# Patient Record
Sex: Male | Born: 1950 | Race: White | Hispanic: No | Marital: Married | Smoking: Never smoker
Health system: Southern US, Community
[De-identification: ages and names within clinical notes are randomized; demographics above are authoritative.]

## PROBLEM LIST (undated history)

## (undated) HISTORY — PX: KNEE SURGERY: SHX244

## (undated) HISTORY — PX: APPENDECTOMY: SHX54

---

## 1999-07-09 ENCOUNTER — Ambulatory Visit (HOSPITAL_COMMUNITY): Admission: RE | Admit: 1999-07-09 | Discharge: 1999-07-09 | Payer: Self-pay | Admitting: *Deleted

## 2001-04-21 ENCOUNTER — Encounter: Payer: Self-pay | Admitting: Surgery

## 2001-04-21 ENCOUNTER — Encounter: Admission: RE | Admit: 2001-04-21 | Discharge: 2001-04-21 | Payer: Self-pay | Admitting: Surgery

## 2005-07-18 ENCOUNTER — Ambulatory Visit (HOSPITAL_COMMUNITY): Admission: RE | Admit: 2005-07-18 | Discharge: 2005-07-18 | Payer: Self-pay | Admitting: Chiropractic Medicine

## 2007-04-15 IMAGING — CR DG LUMBAR SPINE COMPLETE 4+V
5 series · 5 of 5 positions shown · non-contrast
Comparison: None.

CLINICAL DATA: Patient has had low back pain for two months.
 LUMBAR SPINE ? 4 VIEW:

[t l-spine a.p. *]
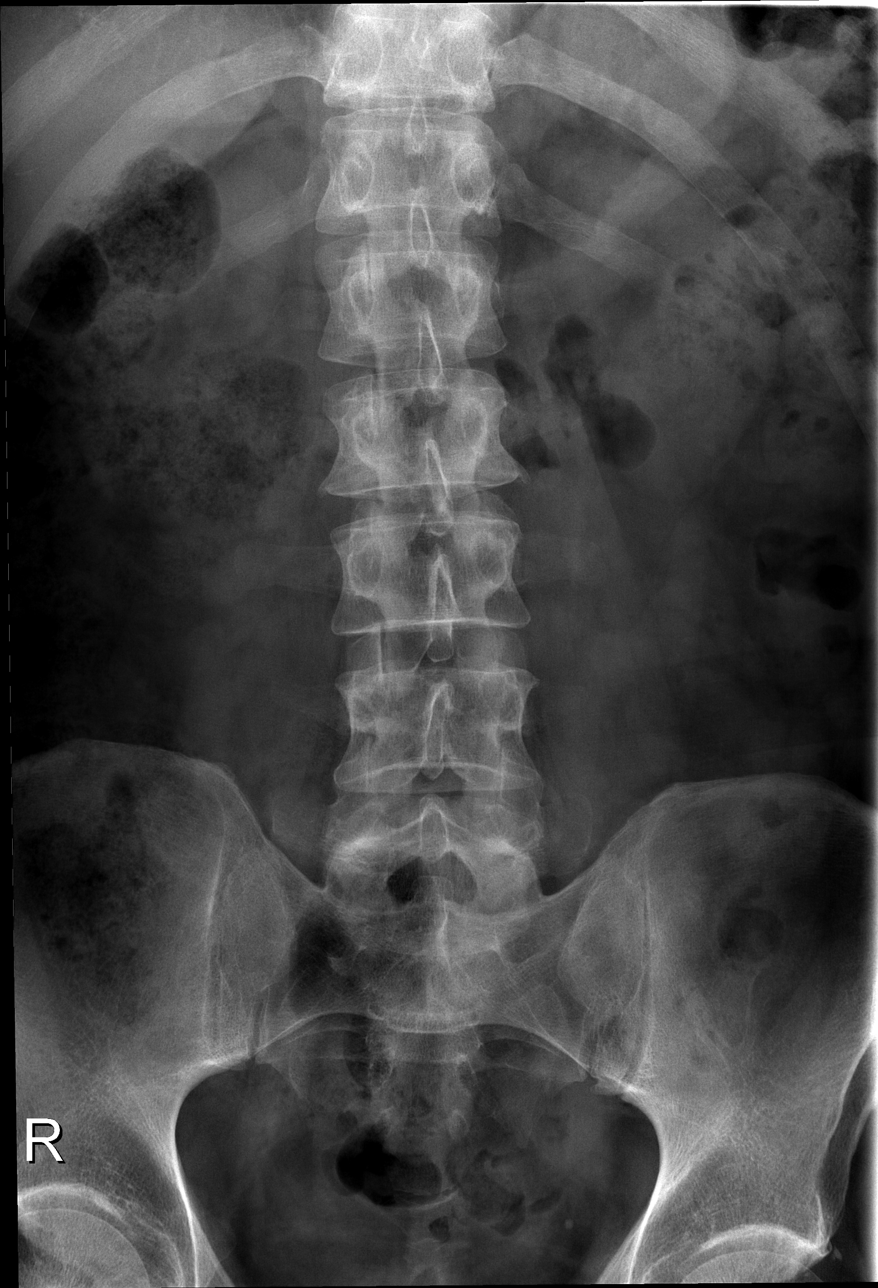

[t l-spine oblique exposure]
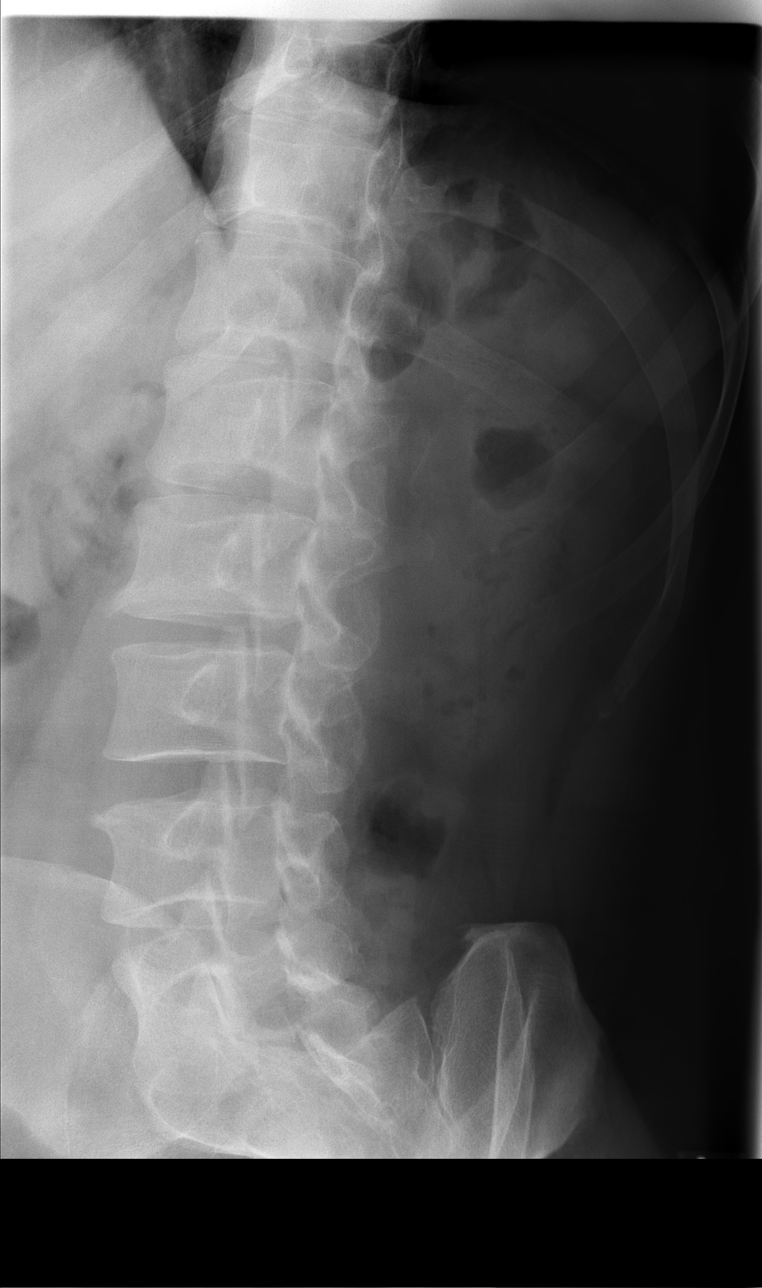

[t l-spine oblique exposure *]
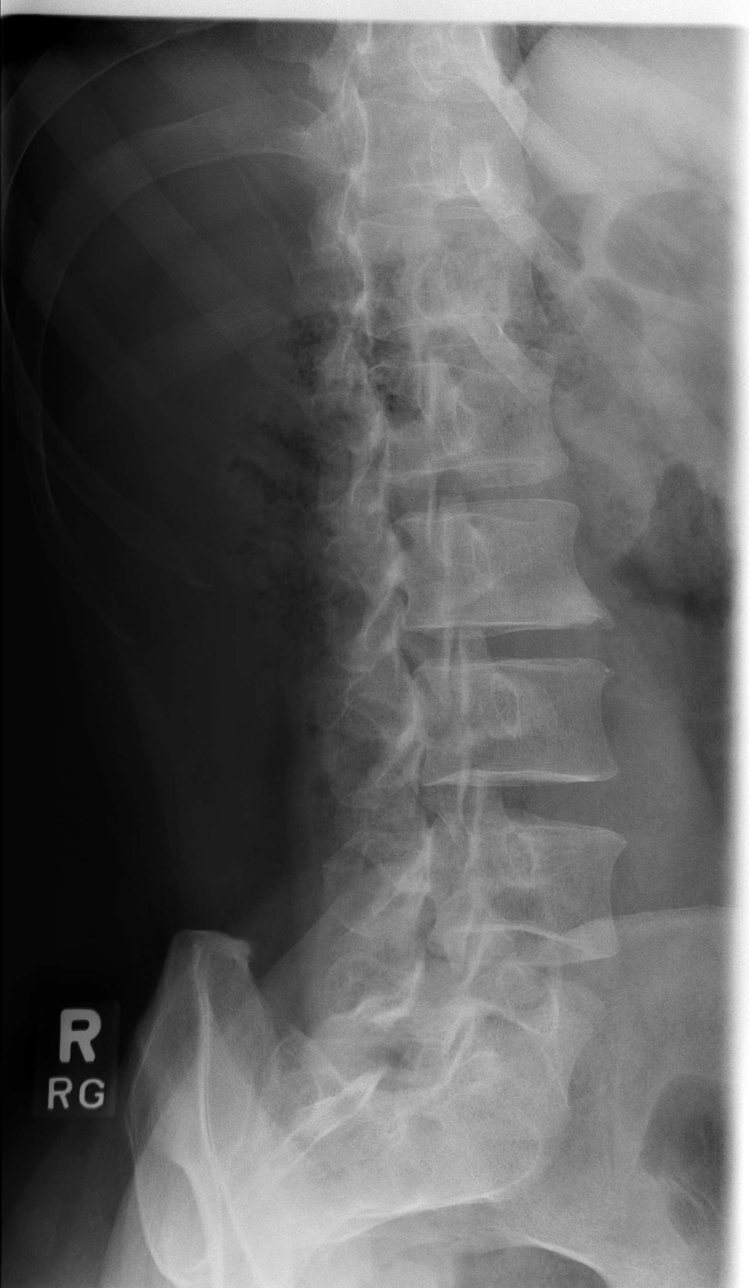

[t l-spine lat]
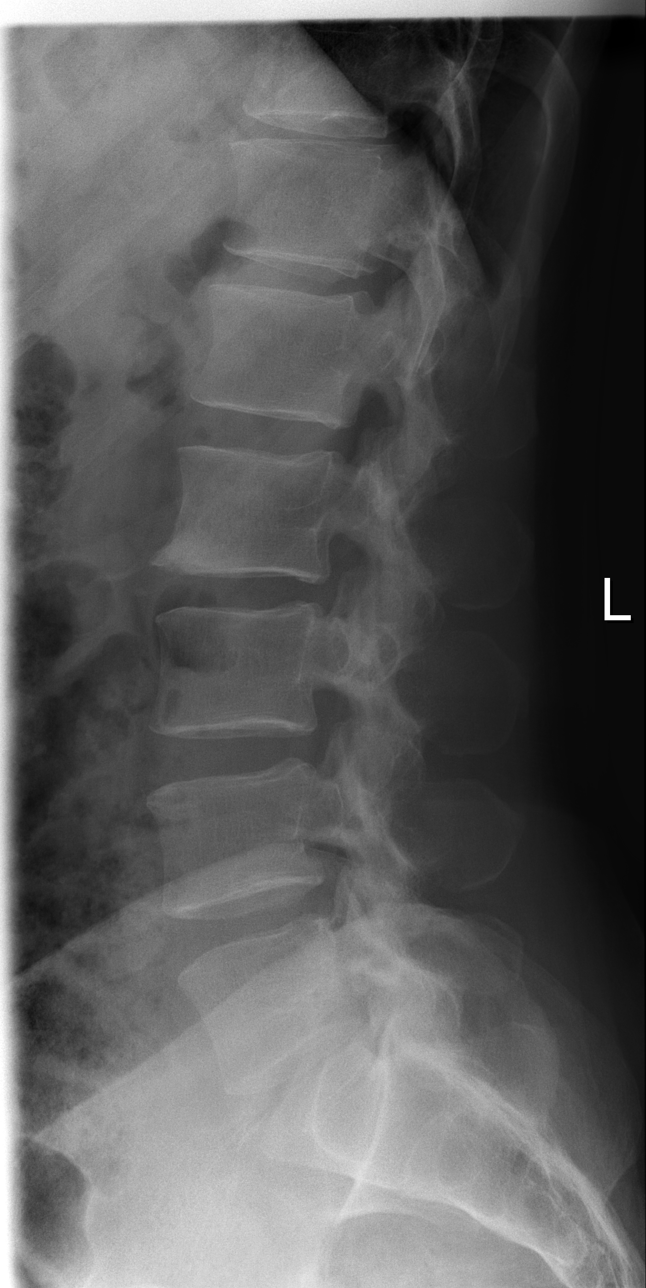

[t l-spine l5-s1 spot]
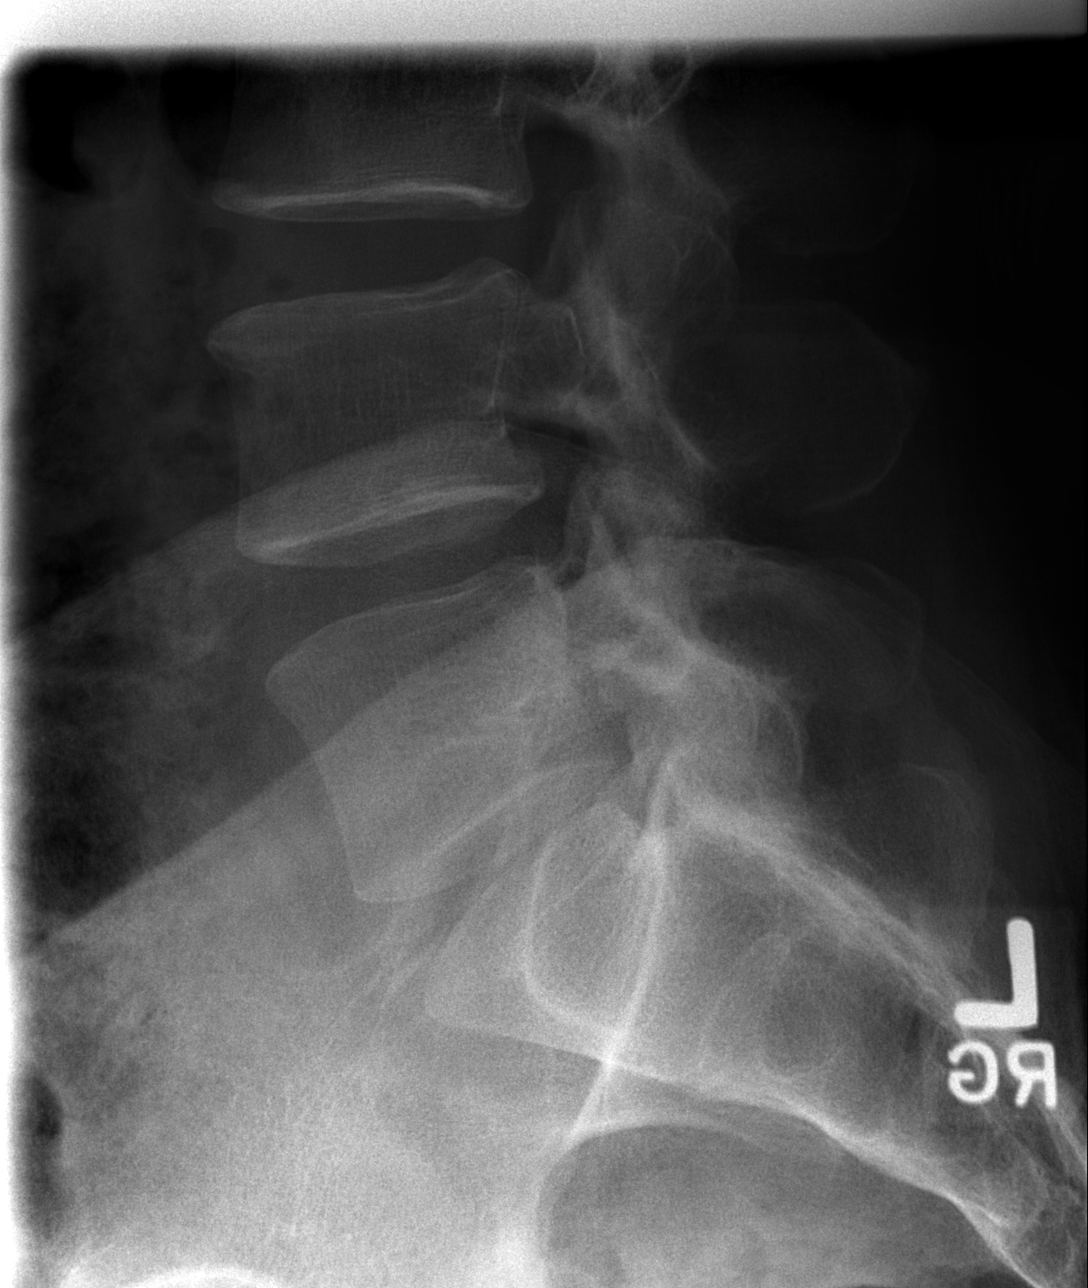

[5 of 5 positions shown; findings below may reference images not displayed]

FINDINGS: There are five lumbar-type vertebrae.  No pars defects.  There is satisfactory alignment.  Mild degenerative disc disease is noted at L2-3.  No acute abnormality.
IMPRESSION: Mild degenerative disc disease is noted at L2-3 without other abnormality.

## 2014-02-18 ENCOUNTER — Ambulatory Visit (INDEPENDENT_AMBULATORY_CARE_PROVIDER_SITE_OTHER): Payer: PRIVATE HEALTH INSURANCE

## 2014-02-18 ENCOUNTER — Ambulatory Visit (INDEPENDENT_AMBULATORY_CARE_PROVIDER_SITE_OTHER): Payer: PRIVATE HEALTH INSURANCE | Admitting: Podiatrist

## 2014-02-18 ENCOUNTER — Encounter: Payer: Self-pay | Admitting: Podiatrist

## 2014-02-18 VITALS — BP 124/76 | HR 64 | Resp 16 | Ht 73.0 in | Wt 200.0 lb

## 2014-02-18 DIAGNOSIS — M6740 Ganglion, unspecified site: Secondary | ICD-10-CM

## 2014-02-18 DIAGNOSIS — M7150 Other bursitis, not elsewhere classified, unspecified site: Secondary | ICD-10-CM

## 2014-02-18 NOTE — Progress Notes (Signed)
   Subjective:    Patient ID: [name], male    DOB: 09-Jan-1951, 63 y.o.   MRN: [id number]  HPI Comments: Right lateral side of foot, sharp pain , painful with pressure and to the touch. Its been going on for about 3 months , knot on the side of foot it has gotten a little bit bigger   Foot Pain      Review of Systems  Musculoskeletal:       Difficulty walking   Allergic/Immunologic: Positive for food allergies.       Objective:   Physical Exam Patient is awake, alert, and oriented x 3.  In no acute distress.  Vascular status is intact with palpable pedal pulses at 2/4 DP and PT bilateral and capillary refill time within normal limits. Neurological sensation is also intact bilaterally via Semmes Weinstein monofilament at 5/5 sites. Light touch, vibratory sensation, Achilles tendon reflex is intact. Dermatological exam reveals skin color, turger and texture as normal. No open lesions present.  Musculature intact with dorsiflexion, plantarflexion, inversion, eversion.  Pain along lateral aspect of the right foot is noted. Just proximal to the fifth metatarsal base and insertion of the peroneus brevis tendon. There is an area of swelling that is seen both clinically and on x-ray there appears to be consistent with a cystic/bursitis inflammation. Discrete pain on palpation in this area is also noted in subjectively reported.    Assessment & Plan:  Bursitis, cyst lateral right foot  Plan: Discussed trying to drain the cyst versus a steroid injection and we decided to do the steroid injection at today's visit this was carried out under sterile technique he tolerated this very well. He will call if this does not help him we will consider drainage at that time.

## 2014-02-18 NOTE — Patient Instructions (Signed)

## 2015-10-05 DIAGNOSIS — Z125 Encounter for screening for malignant neoplasm of prostate: Secondary | ICD-10-CM | POA: Diagnosis not present

## 2015-10-05 DIAGNOSIS — Z136 Encounter for screening for cardiovascular disorders: Secondary | ICD-10-CM | POA: Diagnosis not present

## 2015-10-05 DIAGNOSIS — Z0000 Encounter for general adult medical examination without abnormal findings: Secondary | ICD-10-CM | POA: Diagnosis not present

## 2015-10-05 DIAGNOSIS — G4733 Obstructive sleep apnea (adult) (pediatric): Secondary | ICD-10-CM | POA: Diagnosis not present

## 2016-11-08 DIAGNOSIS — N529 Male erectile dysfunction, unspecified: Secondary | ICD-10-CM | POA: Diagnosis not present

## 2016-11-08 DIAGNOSIS — Z0000 Encounter for general adult medical examination without abnormal findings: Secondary | ICD-10-CM | POA: Diagnosis not present

## 2016-11-08 DIAGNOSIS — Z125 Encounter for screening for malignant neoplasm of prostate: Secondary | ICD-10-CM | POA: Diagnosis not present

## 2016-11-08 DIAGNOSIS — Z1159 Encounter for screening for other viral diseases: Secondary | ICD-10-CM | POA: Diagnosis not present

## 2016-11-08 DIAGNOSIS — G4733 Obstructive sleep apnea (adult) (pediatric): Secondary | ICD-10-CM | POA: Diagnosis not present

## 2016-11-08 DIAGNOSIS — R102 Pelvic and perineal pain: Secondary | ICD-10-CM | POA: Diagnosis not present

## 2016-11-08 DIAGNOSIS — Z1389 Encounter for screening for other disorder: Secondary | ICD-10-CM | POA: Diagnosis not present

## 2016-12-24 DIAGNOSIS — H2513 Age-related nuclear cataract, bilateral: Secondary | ICD-10-CM | POA: Diagnosis not present

## 2016-12-24 DIAGNOSIS — H524 Presbyopia: Secondary | ICD-10-CM | POA: Diagnosis not present

## 2016-12-24 DIAGNOSIS — H5203 Hypermetropia, bilateral: Secondary | ICD-10-CM | POA: Diagnosis not present

## 2016-12-24 DIAGNOSIS — H52223 Regular astigmatism, bilateral: Secondary | ICD-10-CM | POA: Diagnosis not present

## 2017-09-10 DIAGNOSIS — K645 Perianal venous thrombosis: Secondary | ICD-10-CM | POA: Diagnosis not present

## 2017-11-18 DIAGNOSIS — E559 Vitamin D deficiency, unspecified: Secondary | ICD-10-CM | POA: Diagnosis not present

## 2017-11-18 DIAGNOSIS — Z6827 Body mass index (BMI) 27.0-27.9, adult: Secondary | ICD-10-CM | POA: Diagnosis not present

## 2017-11-18 DIAGNOSIS — Z125 Encounter for screening for malignant neoplasm of prostate: Secondary | ICD-10-CM | POA: Diagnosis not present

## 2017-11-18 DIAGNOSIS — Z1389 Encounter for screening for other disorder: Secondary | ICD-10-CM | POA: Diagnosis not present

## 2017-11-18 DIAGNOSIS — Z0000 Encounter for general adult medical examination without abnormal findings: Secondary | ICD-10-CM | POA: Diagnosis not present

## 2017-11-18 DIAGNOSIS — G4733 Obstructive sleep apnea (adult) (pediatric): Secondary | ICD-10-CM | POA: Diagnosis not present

## 2017-11-18 DIAGNOSIS — N529 Male erectile dysfunction, unspecified: Secondary | ICD-10-CM | POA: Diagnosis not present

## 2018-12-02 DIAGNOSIS — G4733 Obstructive sleep apnea (adult) (pediatric): Secondary | ICD-10-CM | POA: Diagnosis not present

## 2018-12-02 DIAGNOSIS — E559 Vitamin D deficiency, unspecified: Secondary | ICD-10-CM | POA: Diagnosis not present

## 2018-12-02 DIAGNOSIS — Z125 Encounter for screening for malignant neoplasm of prostate: Secondary | ICD-10-CM | POA: Diagnosis not present

## 2018-12-02 DIAGNOSIS — M79673 Pain in unspecified foot: Secondary | ICD-10-CM | POA: Diagnosis not present

## 2018-12-02 DIAGNOSIS — Z0000 Encounter for general adult medical examination without abnormal findings: Secondary | ICD-10-CM | POA: Diagnosis not present

## 2018-12-02 DIAGNOSIS — Z1389 Encounter for screening for other disorder: Secondary | ICD-10-CM | POA: Diagnosis not present

## 2018-12-02 DIAGNOSIS — N529 Male erectile dysfunction, unspecified: Secondary | ICD-10-CM | POA: Diagnosis not present

## 2018-12-29 DIAGNOSIS — Z0000 Encounter for general adult medical examination without abnormal findings: Secondary | ICD-10-CM | POA: Diagnosis not present

## 2018-12-29 DIAGNOSIS — Z125 Encounter for screening for malignant neoplasm of prostate: Secondary | ICD-10-CM | POA: Diagnosis not present

## 2018-12-29 DIAGNOSIS — M79673 Pain in unspecified foot: Secondary | ICD-10-CM | POA: Diagnosis not present

## 2019-02-27 ENCOUNTER — Other Ambulatory Visit: Payer: Self-pay | Admitting: Internal Medicine

## 2019-02-27 DIAGNOSIS — Z20822 Contact with and (suspected) exposure to covid-19: Secondary | ICD-10-CM

## 2019-03-02 LAB — NOVEL CORONAVIRUS, NAA: SARS-CoV-2, NAA: NOT DETECTED

## 2019-05-07 DIAGNOSIS — M545 Low back pain: Secondary | ICD-10-CM | POA: Diagnosis not present

## 2019-05-07 DIAGNOSIS — R269 Unspecified abnormalities of gait and mobility: Secondary | ICD-10-CM | POA: Diagnosis not present

## 2019-05-24 DIAGNOSIS — M545 Low back pain: Secondary | ICD-10-CM | POA: Diagnosis not present

## 2019-05-24 DIAGNOSIS — R269 Unspecified abnormalities of gait and mobility: Secondary | ICD-10-CM | POA: Diagnosis not present

## 2019-05-31 DIAGNOSIS — R269 Unspecified abnormalities of gait and mobility: Secondary | ICD-10-CM | POA: Diagnosis not present

## 2019-05-31 DIAGNOSIS — M545 Low back pain: Secondary | ICD-10-CM | POA: Diagnosis not present

## 2019-08-11 ENCOUNTER — Ambulatory Visit: Payer: PPO | Attending: Internal Medicine

## 2019-08-11 DIAGNOSIS — Z20822 Contact with and (suspected) exposure to covid-19: Secondary | ICD-10-CM | POA: Insufficient documentation

## 2019-08-12 LAB — NOVEL CORONAVIRUS, NAA: SARS-CoV-2, NAA: NOT DETECTED

## 2019-08-18 ENCOUNTER — Ambulatory Visit: Payer: PPO | Attending: Internal Medicine

## 2019-08-18 DIAGNOSIS — Z20822 Contact with and (suspected) exposure to covid-19: Secondary | ICD-10-CM | POA: Diagnosis not present

## 2019-08-19 LAB — NOVEL CORONAVIRUS, NAA: SARS-CoV-2, NAA: NOT DETECTED

## 2019-08-21 ENCOUNTER — Ambulatory Visit: Payer: Medicare Other | Attending: Internal Medicine

## 2019-08-21 DIAGNOSIS — Z23 Encounter for immunization: Secondary | ICD-10-CM

## 2019-08-21 NOTE — Progress Notes (Signed)
   Covid-19 Vaccination Clinic  Name:  [name]    MRN: [id number] DOB: 1951/06/18  08/21/2019  [name] was observed post Covid-19 immunization for 15 minutes without incidence. He was provided with Vaccine Information Sheet and instruction to access the V-Safe system.   [name] was instructed to call 911 with any severe reactions post vaccine: Marland Kitchen Difficulty breathing  . Swelling of your face and throat  . A fast heartbeat  . A bad rash all over your body  . Dizziness and weakness    Immunizations Administered    Name Date Dose VIS Date Route   Pfizer COVID-19 Vaccine 08/21/2019 10:53 AM 0.3 mL 07/16/2019 Intramuscular   Manufacturer: [employer], [employer]   Lot: [id number]   NDC: [address]-5

## 2019-09-11 ENCOUNTER — Ambulatory Visit: Payer: Medicare Other | Attending: Internal Medicine

## 2019-09-11 DIAGNOSIS — Z23 Encounter for immunization: Secondary | ICD-10-CM

## 2019-09-11 NOTE — Progress Notes (Signed)
   Covid-19 Vaccination Clinic  Name:  [name]    MRN: [id number] DOB: November 28, 1950  09/11/2019  [name] was observed post Covid-19 immunization for 15 minutes without incidence. He was provided with Vaccine Information Sheet and instruction to access the V-Safe system.   [name] was instructed to call 911 with any severe reactions post vaccine: Marland Kitchen Difficulty breathing  . Swelling of your face and throat  . A fast heartbeat  . A bad rash all over your body  . Dizziness and weakness    Immunizations Administered    Name Date Dose VIS Date Route   Pfizer COVID-19 Vaccine 09/11/2019 10:37 AM 0.3 mL 07/16/2019 Intramuscular   Manufacturer: [employer], [employer]   Lot: [id number]   NDC: [address]-4

## 2019-11-30 DIAGNOSIS — H00025 Hordeolum internum left lower eyelid: Secondary | ICD-10-CM | POA: Diagnosis not present

## 2020-01-12 DIAGNOSIS — Z1389 Encounter for screening for other disorder: Secondary | ICD-10-CM | POA: Diagnosis not present

## 2020-01-12 DIAGNOSIS — E559 Vitamin D deficiency, unspecified: Secondary | ICD-10-CM | POA: Diagnosis not present

## 2020-01-12 DIAGNOSIS — Z125 Encounter for screening for malignant neoplasm of prostate: Secondary | ICD-10-CM | POA: Diagnosis not present

## 2020-01-12 DIAGNOSIS — Z0000 Encounter for general adult medical examination without abnormal findings: Secondary | ICD-10-CM | POA: Diagnosis not present

## 2020-01-12 DIAGNOSIS — G4733 Obstructive sleep apnea (adult) (pediatric): Secondary | ICD-10-CM | POA: Diagnosis not present

## 2020-01-12 DIAGNOSIS — R448 Other symptoms and signs involving general sensations and perceptions: Secondary | ICD-10-CM | POA: Diagnosis not present

## 2020-01-12 DIAGNOSIS — N529 Male erectile dysfunction, unspecified: Secondary | ICD-10-CM | POA: Diagnosis not present

## 2020-02-23 DIAGNOSIS — R946 Abnormal results of thyroid function studies: Secondary | ICD-10-CM | POA: Diagnosis not present

## 2020-04-13 DIAGNOSIS — Z1211 Encounter for screening for malignant neoplasm of colon: Secondary | ICD-10-CM | POA: Diagnosis not present

## 2020-07-26 DIAGNOSIS — E039 Hypothyroidism, unspecified: Secondary | ICD-10-CM | POA: Diagnosis not present

## 2020-07-26 DIAGNOSIS — R5383 Other fatigue: Secondary | ICD-10-CM | POA: Diagnosis not present

## 2020-07-26 DIAGNOSIS — E60 Dietary zinc deficiency: Secondary | ICD-10-CM | POA: Diagnosis not present

## 2020-07-26 DIAGNOSIS — F5101 Primary insomnia: Secondary | ICD-10-CM | POA: Diagnosis not present

## 2020-07-26 DIAGNOSIS — K5900 Constipation, unspecified: Secondary | ICD-10-CM | POA: Diagnosis not present

## 2020-08-15 DIAGNOSIS — Z20822 Contact with and (suspected) exposure to covid-19: Secondary | ICD-10-CM | POA: Diagnosis not present

## 2020-08-16 DIAGNOSIS — Z20822 Contact with and (suspected) exposure to covid-19: Secondary | ICD-10-CM | POA: Diagnosis not present

## 2020-08-16 DIAGNOSIS — Z03818 Encounter for observation for suspected exposure to other biological agents ruled out: Secondary | ICD-10-CM | POA: Diagnosis not present

## 2021-03-29 DIAGNOSIS — J069 Acute upper respiratory infection, unspecified: Secondary | ICD-10-CM | POA: Diagnosis not present

## 2021-03-29 DIAGNOSIS — U071 COVID-19: Secondary | ICD-10-CM | POA: Diagnosis not present

## 2021-03-29 DIAGNOSIS — R6883 Chills (without fever): Secondary | ICD-10-CM | POA: Diagnosis not present

## 2021-03-29 DIAGNOSIS — R051 Acute cough: Secondary | ICD-10-CM | POA: Diagnosis not present

## 2021-03-29 DIAGNOSIS — R0981 Nasal congestion: Secondary | ICD-10-CM | POA: Diagnosis not present

## 2021-03-29 DIAGNOSIS — R519 Headache, unspecified: Secondary | ICD-10-CM | POA: Diagnosis not present

## 2021-05-24 DIAGNOSIS — Z136 Encounter for screening for cardiovascular disorders: Secondary | ICD-10-CM | POA: Diagnosis not present

## 2021-05-24 DIAGNOSIS — Z1389 Encounter for screening for other disorder: Secondary | ICD-10-CM | POA: Diagnosis not present

## 2021-05-24 DIAGNOSIS — E559 Vitamin D deficiency, unspecified: Secondary | ICD-10-CM | POA: Diagnosis not present

## 2021-05-24 DIAGNOSIS — G4733 Obstructive sleep apnea (adult) (pediatric): Secondary | ICD-10-CM | POA: Diagnosis not present

## 2021-05-24 DIAGNOSIS — Z13220 Encounter for screening for lipoid disorders: Secondary | ICD-10-CM | POA: Diagnosis not present

## 2021-05-24 DIAGNOSIS — N529 Male erectile dysfunction, unspecified: Secondary | ICD-10-CM | POA: Diagnosis not present

## 2021-05-24 DIAGNOSIS — R448 Other symptoms and signs involving general sensations and perceptions: Secondary | ICD-10-CM | POA: Diagnosis not present

## 2021-05-24 DIAGNOSIS — Z0000 Encounter for general adult medical examination without abnormal findings: Secondary | ICD-10-CM | POA: Diagnosis not present

## 2021-05-24 DIAGNOSIS — Z125 Encounter for screening for malignant neoplasm of prostate: Secondary | ICD-10-CM | POA: Diagnosis not present

## 2021-06-21 DIAGNOSIS — Z1211 Encounter for screening for malignant neoplasm of colon: Secondary | ICD-10-CM | POA: Diagnosis not present

## 2022-01-11 DIAGNOSIS — R61 Generalized hyperhidrosis: Secondary | ICD-10-CM | POA: Diagnosis not present

## 2022-01-15 ENCOUNTER — Other Ambulatory Visit: Payer: Self-pay | Admitting: Physician Assistant

## 2022-01-15 ENCOUNTER — Ambulatory Visit
Admission: RE | Admit: 2022-01-15 | Discharge: 2022-01-15 | Disposition: A | Discharge status: Home / Self Care | Payer: PPO | Source: Ambulatory Visit | Attending: Physician Assistant | Admitting: Physician Assistant

## 2022-01-15 DIAGNOSIS — J984 Other disorders of lung: Secondary | ICD-10-CM | POA: Diagnosis not present

## 2022-01-15 DIAGNOSIS — R61 Generalized hyperhidrosis: Secondary | ICD-10-CM | POA: Diagnosis not present

## 2022-01-24 DIAGNOSIS — Z1331 Encounter for screening for depression: Secondary | ICD-10-CM | POA: Diagnosis not present

## 2022-01-24 DIAGNOSIS — N529 Male erectile dysfunction, unspecified: Secondary | ICD-10-CM | POA: Diagnosis not present

## 2022-01-24 DIAGNOSIS — Z1211 Encounter for screening for malignant neoplasm of colon: Secondary | ICD-10-CM | POA: Diagnosis not present

## 2022-01-24 DIAGNOSIS — Z0000 Encounter for general adult medical examination without abnormal findings: Secondary | ICD-10-CM | POA: Diagnosis not present

## 2022-01-24 DIAGNOSIS — Z125 Encounter for screening for malignant neoplasm of prostate: Secondary | ICD-10-CM | POA: Diagnosis not present

## 2022-01-24 DIAGNOSIS — E559 Vitamin D deficiency, unspecified: Secondary | ICD-10-CM | POA: Diagnosis not present

## 2022-01-24 DIAGNOSIS — R448 Other symptoms and signs involving general sensations and perceptions: Secondary | ICD-10-CM | POA: Diagnosis not present

## 2022-01-24 DIAGNOSIS — G4733 Obstructive sleep apnea (adult) (pediatric): Secondary | ICD-10-CM | POA: Diagnosis not present

## 2022-02-20 DIAGNOSIS — G4733 Obstructive sleep apnea (adult) (pediatric): Secondary | ICD-10-CM | POA: Diagnosis not present

## 2022-03-05 DIAGNOSIS — M9907 Segmental and somatic dysfunction of upper extremity: Secondary | ICD-10-CM | POA: Diagnosis not present

## 2022-03-05 DIAGNOSIS — M25512 Pain in left shoulder: Secondary | ICD-10-CM | POA: Diagnosis not present

## 2022-03-05 DIAGNOSIS — M9908 Segmental and somatic dysfunction of rib cage: Secondary | ICD-10-CM | POA: Diagnosis not present

## 2022-03-05 DIAGNOSIS — M7552 Bursitis of left shoulder: Secondary | ICD-10-CM | POA: Diagnosis not present

## 2022-03-19 DIAGNOSIS — M25512 Pain in left shoulder: Secondary | ICD-10-CM | POA: Diagnosis not present

## 2022-03-19 DIAGNOSIS — S86012A Strain of left Achilles tendon, initial encounter: Secondary | ICD-10-CM | POA: Diagnosis not present

## 2022-03-28 DIAGNOSIS — G4733 Obstructive sleep apnea (adult) (pediatric): Secondary | ICD-10-CM | POA: Diagnosis not present

## 2022-04-02 DIAGNOSIS — G4733 Obstructive sleep apnea (adult) (pediatric): Secondary | ICD-10-CM | POA: Diagnosis not present

## 2022-04-24 DIAGNOSIS — G4733 Obstructive sleep apnea (adult) (pediatric): Secondary | ICD-10-CM | POA: Diagnosis not present

## 2022-04-30 DIAGNOSIS — M9908 Segmental and somatic dysfunction of rib cage: Secondary | ICD-10-CM | POA: Diagnosis not present

## 2022-04-30 DIAGNOSIS — M9907 Segmental and somatic dysfunction of upper extremity: Secondary | ICD-10-CM | POA: Diagnosis not present

## 2022-04-30 DIAGNOSIS — M25512 Pain in left shoulder: Secondary | ICD-10-CM | POA: Diagnosis not present

## 2022-04-30 DIAGNOSIS — S86012D Strain of left Achilles tendon, subsequent encounter: Secondary | ICD-10-CM | POA: Diagnosis not present

## 2022-05-24 DIAGNOSIS — G4733 Obstructive sleep apnea (adult) (pediatric): Secondary | ICD-10-CM | POA: Diagnosis not present

## 2022-05-29 DIAGNOSIS — M25512 Pain in left shoulder: Secondary | ICD-10-CM | POA: Diagnosis not present

## 2022-05-29 DIAGNOSIS — M7552 Bursitis of left shoulder: Secondary | ICD-10-CM | POA: Diagnosis not present

## 2022-07-08 DIAGNOSIS — G4733 Obstructive sleep apnea (adult) (pediatric): Secondary | ICD-10-CM | POA: Diagnosis not present

## 2022-07-16 DIAGNOSIS — D122 Benign neoplasm of ascending colon: Secondary | ICD-10-CM | POA: Diagnosis not present

## 2022-07-16 DIAGNOSIS — D123 Benign neoplasm of transverse colon: Secondary | ICD-10-CM | POA: Diagnosis not present

## 2022-07-16 DIAGNOSIS — K649 Unspecified hemorrhoids: Secondary | ICD-10-CM | POA: Diagnosis not present

## 2022-07-16 DIAGNOSIS — Z1211 Encounter for screening for malignant neoplasm of colon: Secondary | ICD-10-CM | POA: Diagnosis not present

## 2022-07-18 DIAGNOSIS — D122 Benign neoplasm of ascending colon: Secondary | ICD-10-CM | POA: Diagnosis not present

## 2022-07-18 DIAGNOSIS — D123 Benign neoplasm of transverse colon: Secondary | ICD-10-CM | POA: Diagnosis not present

## 2022-08-23 DIAGNOSIS — G4733 Obstructive sleep apnea (adult) (pediatric): Secondary | ICD-10-CM | POA: Diagnosis not present

## 2022-09-12 DIAGNOSIS — J019 Acute sinusitis, unspecified: Secondary | ICD-10-CM | POA: Diagnosis not present

## 2022-09-27 ENCOUNTER — Ambulatory Visit
Admission: RE | Admit: 2022-09-27 | Discharge: 2022-09-27 | Disposition: A | Discharge status: Home / Self Care | Payer: PPO | Source: Ambulatory Visit | Attending: Physician Assistant | Admitting: Physician Assistant

## 2022-09-27 ENCOUNTER — Other Ambulatory Visit: Payer: Self-pay | Admitting: Physician Assistant

## 2022-09-27 DIAGNOSIS — R058 Other specified cough: Secondary | ICD-10-CM

## 2022-09-27 DIAGNOSIS — R059 Cough, unspecified: Secondary | ICD-10-CM | POA: Diagnosis not present

## 2022-11-12 ENCOUNTER — Ambulatory Visit (INDEPENDENT_AMBULATORY_CARE_PROVIDER_SITE_OTHER): Payer: PPO

## 2022-11-12 ENCOUNTER — Ambulatory Visit (INDEPENDENT_AMBULATORY_CARE_PROVIDER_SITE_OTHER): Payer: PPO | Admitting: Sports Medicine

## 2022-11-12 VITALS — BP 110/82 | HR 53 | Ht 73.0 in | Wt 201.0 lb

## 2022-11-12 DIAGNOSIS — M25572 Pain in left ankle and joints of left foot: Secondary | ICD-10-CM | POA: Diagnosis not present

## 2022-11-12 DIAGNOSIS — M7662 Achilles tendinitis, left leg: Secondary | ICD-10-CM

## 2022-11-12 MED ORDER — MELOXICAM 15 MG PO TABS: 15.0000 mg | ORAL_TABLET | Freq: Every day | ORAL | 0 refills | Status: DC

## 2022-11-12 NOTE — Progress Notes (Signed)
    [name] [address] [place] [address] Phone: [phone]   Assessment and Plan:     1. Left ankle pain, unspecified chronicity 2. Tendonitis, Achilles, left -Chronic with exacerbation, initial sports medicine visit - Consistent with Achilles tendinitis at distal Achilles insertion site based on HPI, physical exam, x-ray - X-ray obtained in clinic.  My interpretation: No acute fracture or dislocation.  Cortical changes seen to posterior calcaneus at Achilles insertion site - Start HEP for Achilles tendinitis - Recommend avoidance of activities that flare pain for 1 week and then gradual reintroduction as tolerated - Start meloxicam 15 mg daily x2 weeks.  If still having pain after 2 weeks, complete 3rd-week of meloxicam. May use remaining meloxicam as needed once daily for pain control.  Do not to use additional NSAIDs while taking meloxicam.  May use Tylenol [phone] mg 2 to 3 times a day for breakthrough pain.  Other orders - meloxicam (MOBIC) 15 MG tablet; Take 1 tablet (15 mg total) by mouth daily.    Pertinent previous records reviewed include none   Follow Up: 3 weeks for reevaluation.  If no improvement or worsening of symptoms, could consider ultrasound versus physical therapy versus ECSWT   Subjective:   I, [name], am serving as a Neurosurgeon for Doctor Richardean Sale  Chief Complaint: left foot pain   HPI:   11/12/22 Patient is a 72 year old male complaining of  left foot pain. Patient states that he keeps straining his achilles tendon, any time he pushes down on his foot a few times it gets to the point where he can't walk for a couple of days, this pain has been going on for a year intermittently, no meds for the pain, no numbness or tingling ,   Relevant Historical Information: None pertinent  Additional pertinent review of systems negative.   Current Outpatient Medications:    meloxicam (MOBIC)  15 MG tablet, Take 1 tablet (15 mg total) by mouth daily., Disp: 30 tablet, Rfl: 0   Objective:     Vitals:   11/12/22 1358  BP: 110/82  Pulse: (!) 53  SpO2: 97%  Weight: 201 lb (91.2 kg)  Height: 6\' 1"  (1.854 m)      Body mass index is 26.52 kg/m.    Physical Exam:    Gen: Appears well, nad, nontoxic and pleasant Psych: Alert and oriented, appropriate mood and affect Neuro: sensation intact, strength is 5/5 with df/pf/inv/ev, muscle tone wnl Skin: no susupicious lesions or rashes  Left foot/ankle:  No deformity, no swelling or effusion TTP to nodule at posterior calcaneus without open lesion, erythema, warmth NTTP over fibular head, lat mal, medial mal, achilles, navicular, base of 5th, ATFL, CFL, deltoid, calcaneous or midfoot ROM DF 30, PF 45, inv/ev intact Negative ant drawer, talar tilt, rotation test, squeeze test. Neg thompson No pain with resisted inversion or eversion  Pain over posterior calcaneus and distal Achilles with double and single-leg plantarflexion weightbearing  Electronically signed by:  [name] [name] 2:56 PM 11/12/22

## 2022-11-12 NOTE — Patient Instructions (Addendum)
Good to see you  Ankle HEP  - Start meloxicam 15 mg daily x2 weeks.  If still having pain after 2 weeks, complete 3rd-week of meloxicam. May use remaining meloxicam as needed once daily for pain control.  Do not to use additional NSAIDs while taking meloxicam.  May use Tylenol 500-1000 mg 2 to 3 times a day for breakthrough pain. 3-4 week follow up  

## 2022-11-12 NOTE — Progress Notes (Deleted)
    [name] [facility] [address] [place] [address] Phone: [phone]   Assessment and Plan:     There are no diagnoses linked to this encounter.  ***   Pertinent previous records reviewed include ***   Follow Up: ***     Subjective:   I, [name], am serving as a Neurosurgeon for Doctor Richardean Sale  Chief Complaint: left foot pain   HPI:   11/13/2022 Patient is a 72 year old male complaining of left foot pain. Patient states  Relevant Historical Information: ***  Additional pertinent review of systems negative.  No current outpatient medications on file.   Objective:     There were no vitals filed for this visit.    There is no height or weight on file to calculate BMI.    Physical Exam:    ***   Electronically signed by:  [name] [facility] 7:25 AM 11/12/22

## 2022-11-13 ENCOUNTER — Ambulatory Visit: Payer: PPO | Admitting: Sports Medicine

## 2022-12-09 NOTE — Progress Notes (Unsigned)
    [name] [name] [address] [place] [address] Phone: [phone]   Assessment and Plan:     There are no diagnoses linked to this encounter.  ***   Pertinent previous records reviewed include ***   Follow Up: ***     Subjective:   I, [name], am serving as a Neurosurgeon for Doctor [name]   Chief Complaint: left foot pain    HPI:    11/12/22 Patient is a 72 year old male complaining of  left foot pain. Patient states that he keeps straining his achilles tendon, any time he pushes down on his foot a few times it gets to the point where he can't walk for a couple of days, this pain has been going on for a year intermittently, no meds for the pain, no numbness or tingling ,   12/10/2022 Patient states    Relevant Historical Information: None pertinent Additional pertinent review of systems negative.   Current Outpatient Medications:    meloxicam (MOBIC) 15 MG tablet, Take 1 tablet (15 mg total) by mouth daily., Disp: 30 tablet, Rfl: 0   Objective:     There were no vitals filed for this visit.    There is no height or weight on file to calculate BMI.    Physical Exam:    ***   Electronically signed by:  [name] [name] 12:30 PM 12/09/22

## 2022-12-10 ENCOUNTER — Ambulatory Visit (INDEPENDENT_AMBULATORY_CARE_PROVIDER_SITE_OTHER): Payer: PPO | Admitting: Sports Medicine

## 2022-12-10 VITALS — HR 67 | Ht 73.0 in | Wt 202.0 lb

## 2022-12-10 DIAGNOSIS — M25572 Pain in left ankle and joints of left foot: Secondary | ICD-10-CM

## 2022-12-10 DIAGNOSIS — M7662 Achilles tendinitis, left leg: Secondary | ICD-10-CM

## 2022-12-10 DIAGNOSIS — G8929 Other chronic pain: Secondary | ICD-10-CM | POA: Diagnosis not present

## 2023-02-07 DIAGNOSIS — R21 Rash and other nonspecific skin eruption: Secondary | ICD-10-CM | POA: Diagnosis not present

## 2023-06-06 DIAGNOSIS — K601 Chronic anal fissure: Secondary | ICD-10-CM | POA: Diagnosis not present

## 2023-06-06 DIAGNOSIS — K6289 Other specified diseases of anus and rectum: Secondary | ICD-10-CM | POA: Diagnosis not present

## 2023-10-13 IMAGING — CR DG CHEST 2V
2 series · 2 of 2 positions shown · non-contrast
Comparison: None Available.

CLINICAL DATA: Generalized hyperhidrosis.  Sweating.

EXAM:
CHEST - 2 VIEW

[w chest pa]
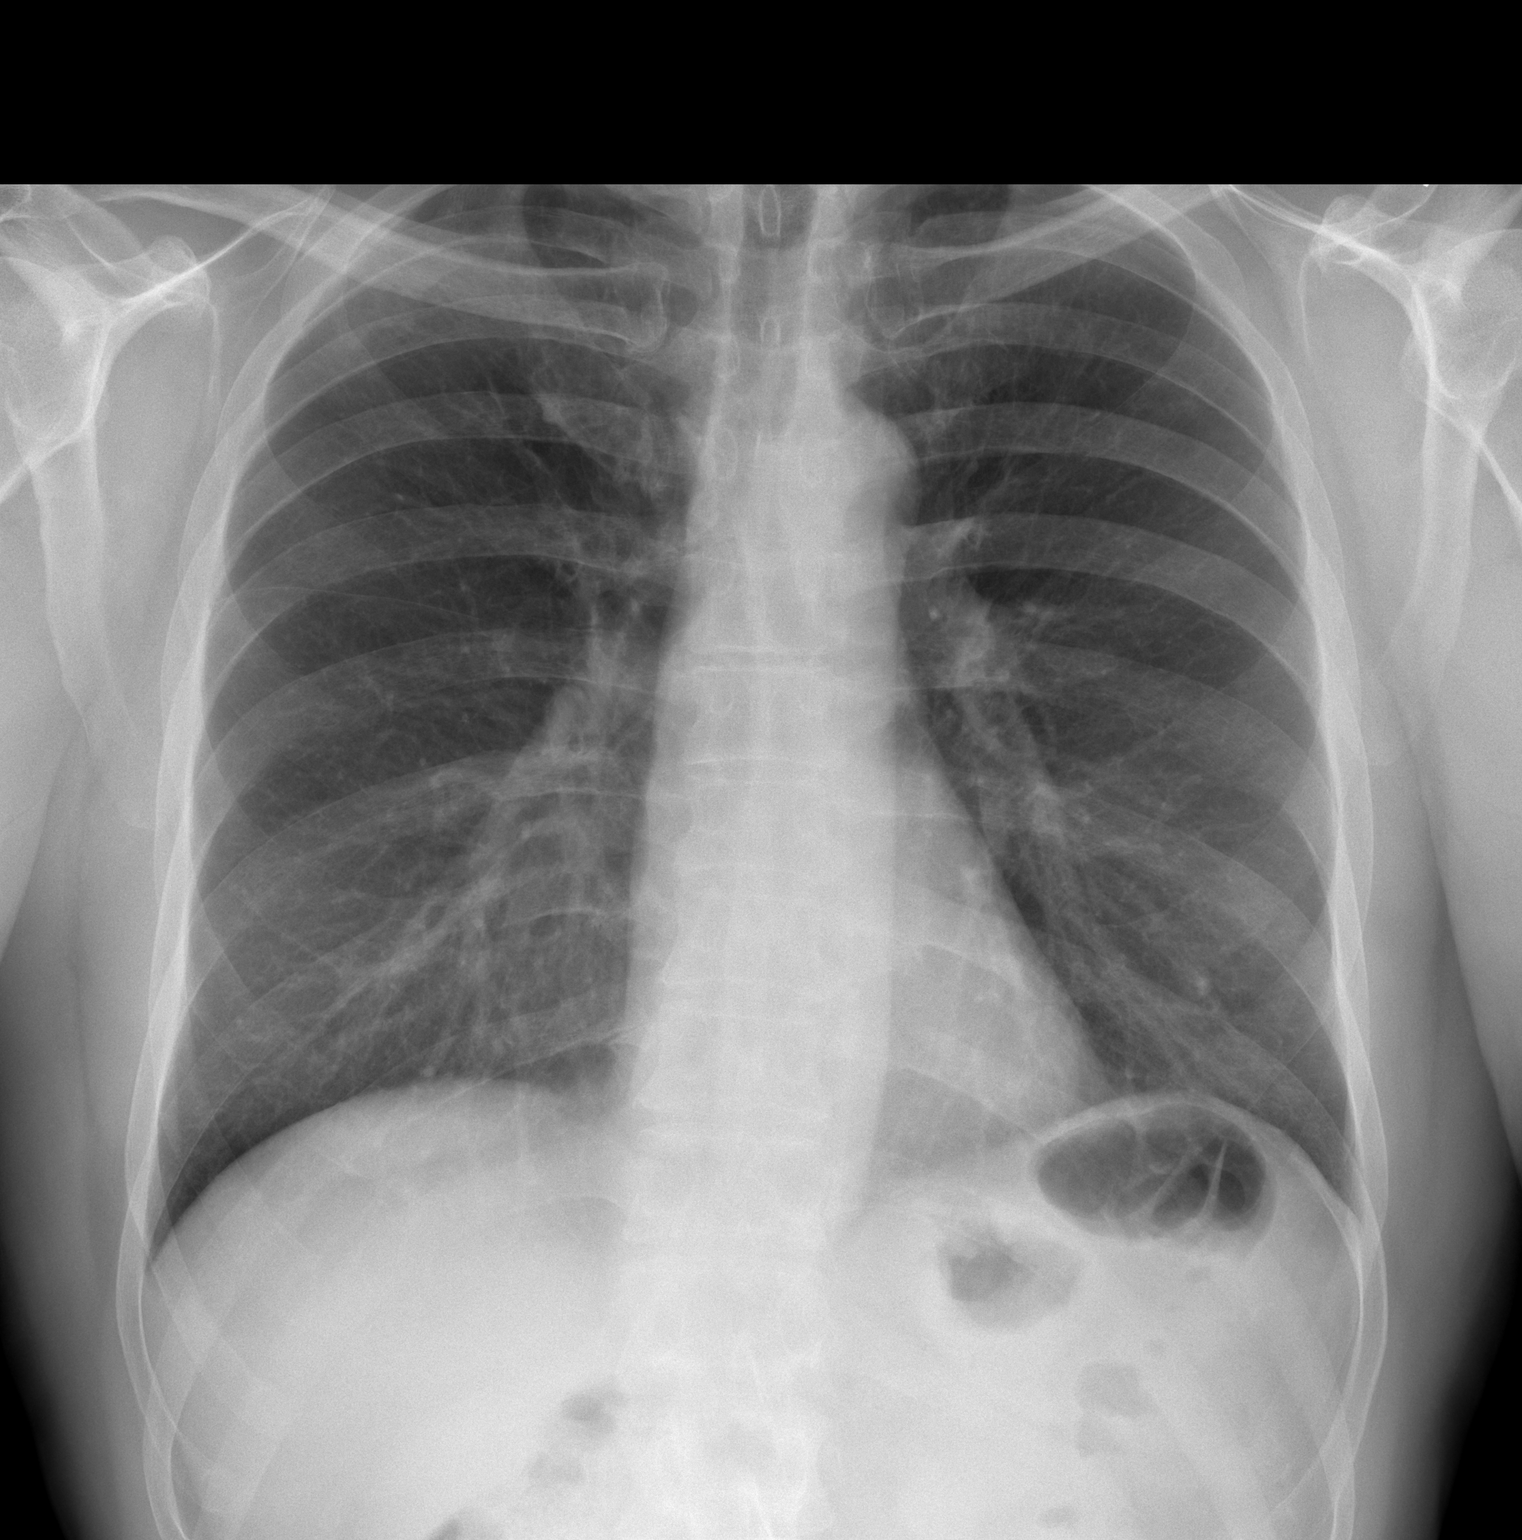

[w chest lat]
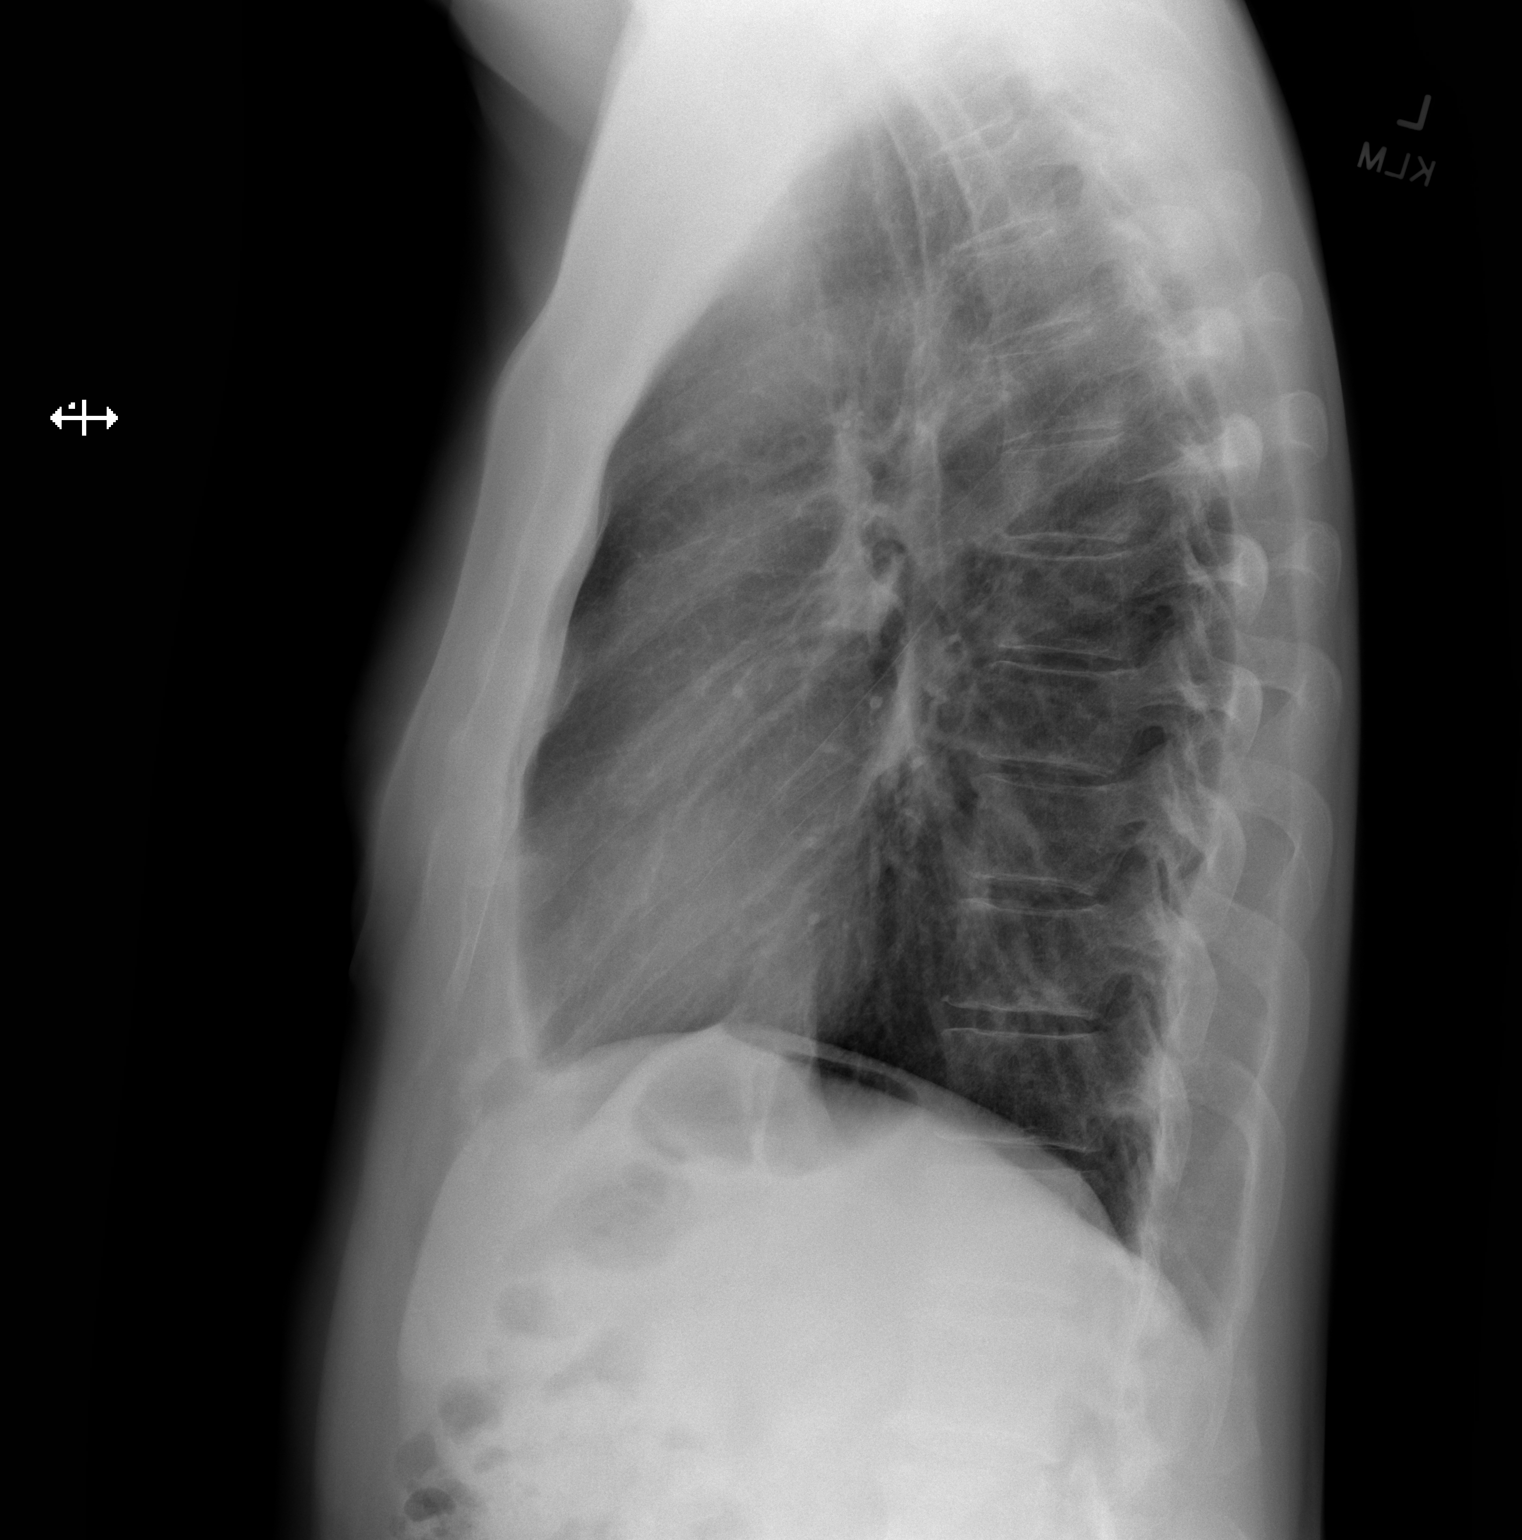

[2 of 2 positions shown; findings below may reference images not displayed]

FINDINGS: The heart is normal in size.The cardiomediastinal contours are
normal. Mild biapical pleuroparenchymal scarring. Pulmonary
vasculature is normal. No consolidation, pleural effusion, or
pneumothorax. No pulmonary mass or visualized nodule. No
interstitial changes. No acute osseous abnormalities are seen.
IMPRESSION: No acute chest finding or explanation for symptoms.

## 2023-12-30 DIAGNOSIS — R079 Chest pain, unspecified: Secondary | ICD-10-CM | POA: Diagnosis not present

## 2023-12-30 DIAGNOSIS — I214 Non-ST elevation (NSTEMI) myocardial infarction: Secondary | ICD-10-CM | POA: Diagnosis not present

## 2023-12-30 DIAGNOSIS — R0789 Other chest pain: Secondary | ICD-10-CM | POA: Diagnosis not present

## 2023-12-30 DIAGNOSIS — E785 Hyperlipidemia, unspecified: Secondary | ICD-10-CM | POA: Diagnosis not present

## 2023-12-30 DIAGNOSIS — I2510 Atherosclerotic heart disease of native coronary artery without angina pectoris: Secondary | ICD-10-CM | POA: Diagnosis not present

## 2023-12-31 DIAGNOSIS — Z955 Presence of coronary angioplasty implant and graft: Secondary | ICD-10-CM | POA: Diagnosis not present

## 2023-12-31 DIAGNOSIS — R0789 Other chest pain: Secondary | ICD-10-CM | POA: Diagnosis not present

## 2023-12-31 DIAGNOSIS — I340 Nonrheumatic mitral (valve) insufficiency: Secondary | ICD-10-CM | POA: Diagnosis not present

## 2023-12-31 DIAGNOSIS — I214 Non-ST elevation (NSTEMI) myocardial infarction: Secondary | ICD-10-CM | POA: Diagnosis not present

## 2023-12-31 DIAGNOSIS — E785 Hyperlipidemia, unspecified: Secondary | ICD-10-CM | POA: Diagnosis not present

## 2023-12-31 DIAGNOSIS — R001 Bradycardia, unspecified: Secondary | ICD-10-CM | POA: Diagnosis not present

## 2023-12-31 DIAGNOSIS — R079 Chest pain, unspecified: Secondary | ICD-10-CM | POA: Diagnosis not present

## 2023-12-31 DIAGNOSIS — Z8249 Family history of ischemic heart disease and other diseases of the circulatory system: Secondary | ICD-10-CM | POA: Diagnosis not present

## 2023-12-31 DIAGNOSIS — I2510 Atherosclerotic heart disease of native coronary artery without angina pectoris: Secondary | ICD-10-CM | POA: Diagnosis not present

## 2024-01-19 DIAGNOSIS — I2510 Atherosclerotic heart disease of native coronary artery without angina pectoris: Secondary | ICD-10-CM | POA: Diagnosis not present

## 2024-01-19 DIAGNOSIS — I214 Non-ST elevation (NSTEMI) myocardial infarction: Secondary | ICD-10-CM | POA: Diagnosis not present

## 2024-01-23 DIAGNOSIS — I214 Non-ST elevation (NSTEMI) myocardial infarction: Secondary | ICD-10-CM | POA: Diagnosis not present

## 2024-01-23 DIAGNOSIS — Z955 Presence of coronary angioplasty implant and graft: Secondary | ICD-10-CM | POA: Diagnosis not present

## 2024-01-26 DIAGNOSIS — Z955 Presence of coronary angioplasty implant and graft: Secondary | ICD-10-CM | POA: Diagnosis not present

## 2024-01-26 DIAGNOSIS — I214 Non-ST elevation (NSTEMI) myocardial infarction: Secondary | ICD-10-CM | POA: Diagnosis not present

## 2024-01-28 DIAGNOSIS — I214 Non-ST elevation (NSTEMI) myocardial infarction: Secondary | ICD-10-CM | POA: Diagnosis not present

## 2024-01-30 DIAGNOSIS — I214 Non-ST elevation (NSTEMI) myocardial infarction: Secondary | ICD-10-CM | POA: Diagnosis not present

## 2024-01-30 DIAGNOSIS — Z955 Presence of coronary angioplasty implant and graft: Secondary | ICD-10-CM | POA: Diagnosis not present

## 2024-02-02 DIAGNOSIS — Z955 Presence of coronary angioplasty implant and graft: Secondary | ICD-10-CM | POA: Diagnosis not present

## 2024-02-02 DIAGNOSIS — I214 Non-ST elevation (NSTEMI) myocardial infarction: Secondary | ICD-10-CM | POA: Diagnosis not present

## 2024-02-04 DIAGNOSIS — I214 Non-ST elevation (NSTEMI) myocardial infarction: Secondary | ICD-10-CM | POA: Diagnosis not present

## 2024-02-04 DIAGNOSIS — Z955 Presence of coronary angioplasty implant and graft: Secondary | ICD-10-CM | POA: Diagnosis not present

## 2024-02-09 DIAGNOSIS — Z955 Presence of coronary angioplasty implant and graft: Secondary | ICD-10-CM | POA: Diagnosis not present

## 2024-02-09 DIAGNOSIS — I214 Non-ST elevation (NSTEMI) myocardial infarction: Secondary | ICD-10-CM | POA: Diagnosis not present

## 2024-02-11 DIAGNOSIS — I214 Non-ST elevation (NSTEMI) myocardial infarction: Secondary | ICD-10-CM | POA: Diagnosis not present

## 2024-02-11 DIAGNOSIS — Z9861 Coronary angioplasty status: Secondary | ICD-10-CM | POA: Diagnosis not present

## 2024-02-16 ENCOUNTER — Telehealth (HOSPITAL_BASED_OUTPATIENT_CLINIC_OR_DEPARTMENT_OTHER): Payer: Self-pay

## 2024-02-16 ENCOUNTER — Encounter (HOSPITAL_BASED_OUTPATIENT_CLINIC_OR_DEPARTMENT_OTHER): Payer: Self-pay | Admitting: Internal Medicine

## 2024-02-16 ENCOUNTER — Telehealth: Payer: Self-pay | Admitting: Pharmacy Technician

## 2024-02-16 ENCOUNTER — Other Ambulatory Visit (HOSPITAL_COMMUNITY): Payer: Self-pay

## 2024-02-16 ENCOUNTER — Ambulatory Visit (HOSPITAL_BASED_OUTPATIENT_CLINIC_OR_DEPARTMENT_OTHER): Admitting: Internal Medicine

## 2024-02-16 VITALS — BP 121/71 | HR 52 | Ht 73.0 in | Wt 196.0 lb

## 2024-02-16 DIAGNOSIS — I214 Non-ST elevation (NSTEMI) myocardial infarction: Secondary | ICD-10-CM | POA: Diagnosis not present

## 2024-02-16 DIAGNOSIS — G720 Drug-induced myopathy: Secondary | ICD-10-CM

## 2024-02-16 DIAGNOSIS — Z955 Presence of coronary angioplasty implant and graft: Secondary | ICD-10-CM

## 2024-02-16 DIAGNOSIS — T466X5D Adverse effect of antihyperlipidemic and antiarteriosclerotic drugs, subsequent encounter: Secondary | ICD-10-CM

## 2024-02-16 DIAGNOSIS — R079 Chest pain, unspecified: Secondary | ICD-10-CM | POA: Diagnosis not present

## 2024-02-16 DIAGNOSIS — Z139 Encounter for screening, unspecified: Secondary | ICD-10-CM | POA: Diagnosis not present

## 2024-02-16 DIAGNOSIS — E785 Hyperlipidemia, unspecified: Secondary | ICD-10-CM | POA: Diagnosis not present

## 2024-02-16 DIAGNOSIS — E7841 Elevated Lipoprotein(a): Secondary | ICD-10-CM

## 2024-02-16 NOTE — Progress Notes (Unsigned)
 LIPID CLINIC CONSULT NOTE  Chief Complaint:  Manage dyslipidemia, recent NSTEMI  Primary Care Physician: Richard Worth SQUIBB, PA  Primary Cardiologist:  None  HPI:  [name] is a 73 y.o. male who is being seen today for the evaluation of dyslipidemia at the request of Richard Lamar, MD. this is a pleasant 73 year old male currently referred for evaluation management of dyslipidemia and recent NSTEMI.  He is a friend of Dr. Charlena Pineda.  [name] lives both in the [place] area as well as the [place] near [place].  He was up in that area and developed chest pain, found to have a non-STEMI.  Troponin peaked to 0.63.  Echocardiogram was performed showing LVEF 60% with mild mitral regurgitation.  He went to the Cath Lab at [facility]) and underwent cardiac catheterization.  This demonstrated severe stenosis of the proximal to mid LAD up to 85% and underwent successful PCI with stenting x 2 with some haziness at the edge of the stent covered with an additional 3.5 x 15 Xience stent and a previously placed 3.5 x 28 mm Xience stent.  Overall the final result was excellent with TIMI-3 flow.  He reports since that episode he has done well denying any chest pain or recurrent shortness of breath.  He has not had a history of high cholesterol but did have family history of heart disease.  As part of his workup he underwent testing for LP(a) which was noted to be elevated at 200.2 nmol/L.  His lipid profile at the time of his MI in February showed total cholesterol 133, HDL 41, triglycerides 82 and LDL 80.  He was started on atorvastatin 80 mg daily but notes he has been having some myalgias with this.  He had previously been on statin therapy and had elevated CK in the past.  He desires assessment of his PSA as he has not had any lab work recently since his primary care provider has retired.  He wishes to establish general cardiology care with me.  PMHx:  History  reviewed. No pertinent past medical history.  Past Surgical History:  Procedure Laterality Date   APPENDECTOMY     KNEE SURGERY      FAMHx:  History reviewed. No pertinent family history.  SOCHx:   reports that he has never smoked. He has never used smokeless tobacco. He reports current alcohol use. No history on file for drug use.  ALLERGIES:  No Known Allergies  ROS: Pertinent items noted in HPI and remainder of comprehensive ROS otherwise negative.  HOME MEDS: Current Outpatient Medications on File Prior to Visit  Medication Sig Dispense Refill   aspirin EC 81 MG tablet Take 81 mg by mouth daily.     atorvastatin (LIPITOR) 80 MG tablet Take 80 mg by mouth daily.     BRILINTA 90 MG TABS tablet Take 90 mg by mouth 2 (two) times daily.     co-enzyme Q-10 30 MG capsule Take 30 mg by mouth 3 (three) times daily.     meloxicam  (MOBIC ) 15 MG tablet Take 1 tablet (15 mg total) by mouth daily. 30 tablet 0   No current facility-administered medications on file prior to visit.    LABS/IMAGING: No results found for this or any previous visit (from the past 48 hours). No results found.  LIPID PANEL: No results found for: CHOL, TRIG, HDL, CHOLHDL, VLDL, LDLCALC, LDLDIRECT  No results found for: LIPOA   WEIGHTS: Wt Readings  from Last 3 Encounters:  02/16/24 196 lb (88.9 kg)  12/10/22 202 lb (91.6 kg)  11/12/22 201 lb (91.2 kg)    VITALS: BP 121/71 (BP Location: Left Arm, Patient Position: Sitting, Cuff Size: Normal)   Pulse (!) 52   Ht 6' 1 (1.854 m)   Wt 196 lb (88.9 kg)   SpO2 96%   BMI 25.86 kg/m   EXAM: General appearance: alert and no distress Lungs: clear to auscultation bilaterally Heart: regular rate and rhythm, S1, S2 normal, no murmur, click, rub or gallop Extremities: extremities normal, atraumatic, no cyanosis or edema Neurologic: Grossly normal  EKG: Deferred  ASSESSMENT: NSTEMI with PCI (DES x 2, 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   [name] had a recent NSTEMI with PCI to the mid LAD.  He is doing well without any chest pain or worsening shortness of breath.  He is on dual antiplatelet therapy with aspirin and Brilinta and was made aware that he needs to remain on this uninterrupted for up to a year ideally however if there is a nonelective procedure could consider stopping the Brilinta after speaking with us  after 6 months.  His lipids in February were above target with an LDL of 80.  He had previous statin intolerance with an elevated CK but was placed on atorvastatin 80 mg daily.  He is having significant myalgias now which is interfering with his exercise.  I advised him to take a statin holiday for 2 weeks to see if this improves but suspect because he has had previous statin intolerance and myopathy that he may be better suited on a PCSK9 inhibitor.  He also has a high LP(a).  I would like to recheck a baseline CK to make sure that this is not again elevated.  Will reach out for prior authorization for Repatha .  Plan then repeat lipids including NMR and LP(a) in about 3 to 4 months.  Will obtain an EKG at that time.  Richard KYM Maxcy, MD, [facility], FNLA, FACP  [place]  [facility]  Medical Director of the Advanced Lipid Disorders &  Cardiovascular Risk Reduction Clinic Diplomate of the American Board of Clinical Lipidology Attending Cardiologist  Direct Dial: [phone]  Fax: [phone]  Website:  [email or web address] [name] 02/16/2024, 10:21 AM

## 2024-02-16 NOTE — Telephone Encounter (Signed)
 Pharmacy Patient Advocate Encounter   Received notification from Physician's Office that prior authorization for Repatha  is required/requested.   Insurance verification completed.   The patient is insured through [facility] ADVANTAGE/RX ADVANCE .   Per test claim: PA required; PA submitted to above mentioned insurance via CoverMyMeds Key/confirmation #/EOC [id number] Status is pending

## 2024-02-16 NOTE — Patient Instructions (Addendum)
 Medication Instructions:  Stay OFF Atorvastatin (Lipitor) for 2 weeks, contact our office when finished with holiday to update on symptoms, how you are feeling off medication  Dr. Mona recommends Repatha  (PCSK9). This is an injectable cholesterol medication self-administered once every 14 days. This medication will likely need prior approval with your insurance company, which we will work on. If the medication is not approved initially, we may need to do an appeal with your insurance.   Administer medication in area of fatty tissue such as abdomen, outer thigh, back of upper arm - and rotate site with each injection Store medication in refrigerator until ready to administer - allow to sit at room temp for 30 mins - 1 hour prior to injection Dispose of medication in a SHARPS container - your pharmacy should be able to direct you on this and proper disposal   If you need a co-pay card for Repatha : https://www.repatha .com/repatha -cost If you need a co-pay card for Praluent: [email or web address]  Patient Assistance:    These foundations have funds at various times.   The PAN Foundation: https://www.panfoundation.org/disease-funds/hypercholesterolemia/ -- can sign up for wait list  The [facility] offers assistance to help pay for medication copays.  They will cover copays for all cholesterol lowering meds, including statins, fibrates, omega-3 fish oils like Vascepa, ezetimibe, Repatha , Praluent, Nexletol, Nexlizet.  The cards are usually good for $2,500 or 12 months, whichever comes first. Our fax # is [phone] (you will need this to apply) Go to healthwellfoundation.org Click on "Apply Now" Answer questions as to whom is applying (patient or representative) Your disease fund will be "hypercholesterolemia - Medicare access" They will ask questions about finances and which medications you are taking for cholesterol When you submit, the approval is usually  within minutes.  You will need to print the card information from the site You will need to show this information to your pharmacy, they will bill your Medicare Part D plan first -then bill Health Well --for the copay.   You can also call them at [phone], although the hold times can be quite long.    *If you need a refill on your cardiac medications before your next appointment, please call your pharmacy*  Lab Work: PSA and CK today  If you have labs (blood work) drawn today and your tests are completely normal, you will receive your results only by: MyChart Message (if you have MyChart) OR A paper copy in the mail If you have any lab test that is abnormal or we need to change your treatment, we will call you to review the results.  Testing/Procedures: Genetic test for Dyslipidemia ordered ([facility]) Cheek swab completed in office Specimen and necessary paperwork mailed.  Follow-Up: At [facility], you and your health needs are our priority.  As part of our continuing mission to provide you with exceptional heart care, our providers are all part of one team.  This team includes your primary Cardiologist (physician) and Advanced Practice Providers or APPs (Physician Assistants and Nurse Practitioners) who all work together to provide you with the care you need, when you need it.  Your next appointment:   3-4 months with Dr. Mona- Primary Cardiology- EKG during visit  Other Instructions Genetic testing was completed in office. Results take about 2-3 weeks to come back. Dr. Mona will reach out with results.

## 2024-02-16 NOTE — Telephone Encounter (Signed)
 Genetic test for Hyperlipidemia ordered ([facility]) Cheek swab completed in office Specimen and necessary paperwork mailed. ID: [id number]

## 2024-02-16 NOTE — Telephone Encounter (Signed)
 Pharmacy Patient Advocate Encounter  Received notification from [facility] ADVANTAGE/RX ADVANCE that Prior Authorization for Repatha  has been APPROVED from 02/16/24 to 08/14/24. Ran test claim, Copay is $47.00- one month. This test claim was processed through [facility]- copay amounts may vary at other pharmacies due to pharmacy/plan contracts, or as the patient moves through the different stages of their insurance plan.   PA #/Case ID/Reference #: [id number]

## 2024-02-17 ENCOUNTER — Ambulatory Visit: Payer: Self-pay | Admitting: Internal Medicine

## 2024-02-17 LAB — PSA: Prostate Specific Ag, Serum: 2.8 ng/mL (ref 0.0–4.0)

## 2024-02-17 LAB — CK: Total CK: 156 U/L (ref 41–331)

## 2024-02-17 MED ORDER — REPATHA SURECLICK 140 MG/ML ~~LOC~~ SOAJ: 140.0000 mg | SUBCUTANEOUS | 3 refills | Status: AC

## 2024-02-17 NOTE — Telephone Encounter (Signed)
 Called patient and discussed Repatha  approval from insurance and copay amount. Patient agreeable to medication cost and treatment, prescription sent to [facility] in [place], [place] per patient request.   Josie RN

## 2024-02-17 NOTE — Addendum Note (Signed)
 Addended by: ANDREZ PRAIRIE on: 02/17/2024 10:00 AM   Modules accepted: Orders

## 2024-02-18 DIAGNOSIS — I214 Non-ST elevation (NSTEMI) myocardial infarction: Secondary | ICD-10-CM | POA: Diagnosis not present

## 2024-02-18 DIAGNOSIS — Z955 Presence of coronary angioplasty implant and graft: Secondary | ICD-10-CM | POA: Diagnosis not present

## 2024-02-20 DIAGNOSIS — I214 Non-ST elevation (NSTEMI) myocardial infarction: Secondary | ICD-10-CM | POA: Diagnosis not present

## 2024-02-21 DIAGNOSIS — M79674 Pain in right toe(s): Secondary | ICD-10-CM | POA: Diagnosis not present

## 2024-02-21 DIAGNOSIS — Z7982 Long term (current) use of aspirin: Secondary | ICD-10-CM | POA: Diagnosis not present

## 2024-02-21 DIAGNOSIS — S161XXA Strain of muscle, fascia and tendon at neck level, initial encounter: Secondary | ICD-10-CM | POA: Diagnosis not present

## 2024-02-21 DIAGNOSIS — M109 Gout, unspecified: Secondary | ICD-10-CM | POA: Diagnosis not present

## 2024-02-21 DIAGNOSIS — S139XXA Sprain of joints and ligaments of unspecified parts of neck, initial encounter: Secondary | ICD-10-CM | POA: Diagnosis not present

## 2024-02-22 ENCOUNTER — Encounter: Payer: Self-pay | Admitting: Internal Medicine

## 2024-02-23 NOTE — Telephone Encounter (Signed)
 Pt c/o medication issue:  1. Name of Medication: BRILINTA 90 MG TABS tablet   2. How are you currently taking this medication (dosage and times per day)? Take 90 mg by mouth 2 (two) times daily.   3. Are you having a reaction (difficulty breathing--STAT)? No  4. What is your medication issue? Patient stated that his medication may be causing him to have gout. Patient would like to know if he could take another medication instead. Patient mentioned the Emergency Room in [place] prescribed him Colchicine to start taking. Please advise.

## 2024-02-24 MED ORDER — CLOPIDOGREL BISULFATE 75 MG PO TABS: 75.0000 mg | ORAL_TABLET | Freq: Every day | ORAL | 3 refills | Status: AC

## 2024-02-24 NOTE — Telephone Encounter (Signed)
 Called patient. Verified name and DOB. Relayed following message:  Gout attributable to Brilinta is not common, but it can increase uric acid levels and if your underlying uric acid level is high, possibly contribute to a gout flare. We could switch your from Brilinta to Plavix .  To make the switch, you will need to take a loading dose of Plavix  600 mg x 1 (8 tablets in the morning instead of Brilinta) and then start Plavix  75 mg daily (1 tablet daily) the next day. You will still remain on aspirin 81 mg daily as well.  I will instruct my nurse to order this.  Dr. Mona  Verified preferred pharmacy. Sent prescription electronically. Patient verbalized understanding.   Josie RN

## 2024-02-25 DIAGNOSIS — I214 Non-ST elevation (NSTEMI) myocardial infarction: Secondary | ICD-10-CM | POA: Diagnosis not present

## 2024-02-25 DIAGNOSIS — Z955 Presence of coronary angioplasty implant and graft: Secondary | ICD-10-CM | POA: Diagnosis not present

## 2024-02-26 DIAGNOSIS — M109 Gout, unspecified: Secondary | ICD-10-CM | POA: Diagnosis not present

## 2024-02-27 DIAGNOSIS — Z955 Presence of coronary angioplasty implant and graft: Secondary | ICD-10-CM | POA: Diagnosis not present

## 2024-02-27 DIAGNOSIS — I214 Non-ST elevation (NSTEMI) myocardial infarction: Secondary | ICD-10-CM | POA: Diagnosis not present

## 2024-03-01 DIAGNOSIS — I214 Non-ST elevation (NSTEMI) myocardial infarction: Secondary | ICD-10-CM | POA: Diagnosis not present

## 2024-03-01 DIAGNOSIS — Z955 Presence of coronary angioplasty implant and graft: Secondary | ICD-10-CM | POA: Diagnosis not present

## 2024-03-05 DIAGNOSIS — I214 Non-ST elevation (NSTEMI) myocardial infarction: Secondary | ICD-10-CM | POA: Diagnosis not present

## 2024-03-05 DIAGNOSIS — Z955 Presence of coronary angioplasty implant and graft: Secondary | ICD-10-CM | POA: Diagnosis not present

## 2024-03-15 DIAGNOSIS — Z955 Presence of coronary angioplasty implant and graft: Secondary | ICD-10-CM | POA: Diagnosis not present

## 2024-03-15 DIAGNOSIS — I214 Non-ST elevation (NSTEMI) myocardial infarction: Secondary | ICD-10-CM | POA: Diagnosis not present

## 2024-03-18 ENCOUNTER — Encounter: Payer: Self-pay | Admitting: Internal Medicine

## 2024-03-25 DIAGNOSIS — M109 Gout, unspecified: Secondary | ICD-10-CM | POA: Diagnosis not present

## 2024-03-26 DIAGNOSIS — Z955 Presence of coronary angioplasty implant and graft: Secondary | ICD-10-CM | POA: Diagnosis not present

## 2024-03-26 DIAGNOSIS — I214 Non-ST elevation (NSTEMI) myocardial infarction: Secondary | ICD-10-CM | POA: Diagnosis not present

## 2024-04-07 DIAGNOSIS — I214 Non-ST elevation (NSTEMI) myocardial infarction: Secondary | ICD-10-CM | POA: Diagnosis not present

## 2024-04-07 DIAGNOSIS — Z955 Presence of coronary angioplasty implant and graft: Secondary | ICD-10-CM | POA: Diagnosis not present

## 2024-04-08 ENCOUNTER — Encounter: Payer: Self-pay | Admitting: Internal Medicine

## 2024-04-12 DIAGNOSIS — I214 Non-ST elevation (NSTEMI) myocardial infarction: Secondary | ICD-10-CM | POA: Diagnosis not present

## 2024-04-12 DIAGNOSIS — Z955 Presence of coronary angioplasty implant and graft: Secondary | ICD-10-CM | POA: Diagnosis not present

## 2024-04-30 DIAGNOSIS — Z955 Presence of coronary angioplasty implant and graft: Secondary | ICD-10-CM | POA: Diagnosis not present

## 2024-04-30 DIAGNOSIS — I214 Non-ST elevation (NSTEMI) myocardial infarction: Secondary | ICD-10-CM | POA: Diagnosis not present

## 2024-05-24 ENCOUNTER — Other Ambulatory Visit: Payer: Self-pay

## 2024-05-24 ENCOUNTER — Ambulatory Visit: Attending: Internal Medicine | Admitting: Internal Medicine

## 2024-05-24 ENCOUNTER — Encounter: Payer: Self-pay | Admitting: Internal Medicine

## 2024-05-24 VITALS — BP 118/68 | HR 55 | Resp 14 | Ht 73.0 in | Wt 191.6 lb

## 2024-05-24 DIAGNOSIS — G720 Drug-induced myopathy: Secondary | ICD-10-CM | POA: Diagnosis not present

## 2024-05-24 DIAGNOSIS — Z8739 Personal history of other diseases of the musculoskeletal system and connective tissue: Secondary | ICD-10-CM

## 2024-05-24 DIAGNOSIS — Z955 Presence of coronary angioplasty implant and graft: Secondary | ICD-10-CM

## 2024-05-24 DIAGNOSIS — E785 Hyperlipidemia, unspecified: Secondary | ICD-10-CM

## 2024-05-24 DIAGNOSIS — I214 Non-ST elevation (NSTEMI) myocardial infarction: Secondary | ICD-10-CM

## 2024-05-24 DIAGNOSIS — T466X5D Adverse effect of antihyperlipidemic and antiarteriosclerotic drugs, subsequent encounter: Secondary | ICD-10-CM

## 2024-05-24 DIAGNOSIS — E7841 Elevated Lipoprotein(a): Secondary | ICD-10-CM

## 2024-05-24 NOTE — Patient Instructions (Signed)
 Medication Instructions:  Your physician recommends that you continue on your current medications as directed. Please refer to the Current Medication list given to you today.  *If you need a refill on your cardiac medications before your next appointment, please call your pharmacy*  Lab Work: TODAY: NMR, LpA, and uric acid  Follow-Up: At [facility], you and your health needs are our priority.  As part of our continuing mission to provide you with exceptional heart care, our providers are all part of one team.  This team includes your primary Cardiologist (physician) and Advanced Practice Providers or APPs (Physician Assistants and Nurse Practitioners) who all work together to provide you with the care you need, when you need it.  Your next appointment:   6 months  Provider:   Dr. Mona

## 2024-05-24 NOTE — Progress Notes (Signed)
 LIPID CLINIC CONSULT NOTE  Chief Complaint:  Manage dyslipidemia, recent NSTEMI  Primary Care Physician: Morna Arthea Loving, MD  Primary Cardiologist:  None  HPI:  [name] is a 73 y.o. male who is being seen today for the evaluation of dyslipidemia at the request of Turmel, Worth SQUIBB, PA. this is a pleasant 73 year old male currently referred for evaluation management of dyslipidemia and recent NSTEMI.  He is a friend of Dr. Charlena Como.  [name] lives both in the [place] area as well as the [place] near [place].  He was up in that area and developed chest pain, found to have a non-STEMI.  Troponin peaked to 0.63.  Echocardiogram was performed showing LVEF 60% with mild mitral regurgitation.  He went to the Cath Lab at [facility] healthcare facility [facility]) and underwent cardiac catheterization.  This demonstrated severe stenosis of the proximal to mid LAD up to 85% and underwent successful PCI with stenting x 2 with some haziness at the edge of the stent covered with an additional 3.5 x 15 Xience stent and a previously placed 3.5 x 28 mm Xience stent.  Overall the final result was excellent with TIMI-3 flow.  He reports since that episode he has done well denying any chest pain or recurrent shortness of breath.  He has not had a history of high cholesterol but did have family history of heart disease.  As part of his workup he underwent testing for LP(a) which was noted to be elevated at 200.2 nmol/L.  His lipid profile at the time of his MI in February showed total cholesterol 133, HDL 41, triglycerides 82 and LDL 80.  He was started on atorvastatin 80 mg daily but notes he has been having some myalgias with this.  He had previously been on statin therapy and had elevated CK in the past.  He desires assessment of his PSA as he has not had any lab work recently since his primary care provider has retired.  He wishes to establish general cardiology care with  me.  05/24/2024  [name] returns today for follow-up.  Overall he seems to be doing well.  He had stopped atorvastatin and was switched to Repatha  which he seems to tolerate.  He had an elevated LP(a).  We did genetic testing which showed an LPA variant, an APOA5 variant and an ApoE (E2/E3) variant.  He has a known elevated LP(a) just over 200.  Hopefully on Repatha  this is come down.  He notes no muscle aches that he had previously on this.  He did have an episode of gout.  His uric acid level was 7.5 over the summer but did not improve much a month later.  He has lost more than 10 pounds with dietary changes and cutting out alcohol.  He is interested to see if his uric acid level has improved.  He is not on allopurinol.  He has had some bruising on combination aspirin and clopidogrel .  PMHx:  History reviewed. No pertinent past medical history.  Past Surgical History:  Procedure Laterality Date   APPENDECTOMY     KNEE SURGERY      FAMHx:  History reviewed. No pertinent family history.  SOCHx:   reports that he has never smoked. He has never used smokeless tobacco. He reports current alcohol use. No history on file for drug use.  ALLERGIES:  No Known Allergies  ROS: Pertinent items noted in HPI and remainder of comprehensive ROS otherwise negative.  HOME MEDS: Current Outpatient Medications on File Prior to Visit  Medication Sig Dispense Refill   aspirin EC 81 MG tablet Take 81 mg by mouth daily.     clopidogrel  (PLAVIX ) 75 MG tablet Take 1 tablet (75 mg total) by mouth daily. Take 8 tablets (600 mg) one time (first dose), then continue to take 1 tablet (75 mg) daily the next day and thereafter 90 tablet 3   Evolocumab  (REPATHA  SURECLICK) 140 MG/ML SOAJ Inject 140 mg into the skin every 14 (fourteen) days. 6 mL 3   No current facility-administered medications on file prior to visit.    LABS/IMAGING: No results found for this or any previous visit (from the past 48 hours). No  results found.  LIPID PANEL: No results found for: CHOL, TRIG, HDL, CHOLHDL, VLDL, LDLCALC, LDLDIRECT  No results found for: LIPOA   WEIGHTS: Wt Readings from Last 3 Encounters:  05/24/24 191 lb 9.6 oz (86.9 kg)  02/16/24 196 lb (88.9 kg)  12/10/22 202 lb (91.6 kg)    VITALS: BP 118/68 (BP Location: Right Arm, Patient Position: Sitting, Cuff Size: Normal)   Pulse (!) 55   Resp 14   Ht 6' 1 (1.854 m)   Wt 191 lb 9.6 oz (86.9 kg)   SpO2 98%   BMI 25.28 kg/m   EXAM: General appearance: alert and no distress Lungs: clear to auscultation bilaterally Heart: regular rate and rhythm, S1, S2 normal, no murmur, click, rub or gallop Extremities: extremities normal, atraumatic, no cyanosis or edema Neurologic: Grossly normal  EKG: EKG Interpretation Date/Time:  Monday May 24 2024 09:30:29 EDT Ventricular Rate:  48 PR Interval:  172 QRS Duration:  84 QT Interval:  450 QTC Calculation: 402 R Axis:   65  Text Interpretation: Sinus bradycardia No previous ECGs available Confirmed by Mona Kent [id number]) on 05/24/2024 9:34:13 AM    ASSESSMENT: NSTEMI with PCI (DES x 2, Xience) to the proximal to mid LAD (12/11/2023, [facility]) - LVEF 55-60% on echo, mild MR Genetic dyslipidemia with LPA gene variant, APO A5 and APO E (E2/E3) Dyslipidemia, goal LDL less than 55 (very high risk) 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   [name] continues to do well on Repatha .  He denies any of the muscle aches he had on the statins.  I am interested to see if his lipids are well-controlled on this as well as if he has had further improvement in LP(a).  Genetic testing showed several variants of unknown significance with a prominence of LPA but also APO A5 and APO E.  He has recently had weight loss  and dietary changes which may also improve his lipids.  Will plan fasting NMR and LP(a) today.  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  Uric acid level was 7.5.  Will repeat that today.  If his uric acid level remains above 6 I would advise evaluation by his PCP to consider starting allopurinol.  Fib Oxistat is contraindicated in patients with coronary artery disease.  Plan follow-up in about 6 months at which time we will consider coming off clopidogrel .  Kent KYM Mona, MD, [facility], FNLA, FACP    [facility]  Medical Director of the Advanced Lipid Disorders &  Cardiovascular Risk Reduction Clinic Diplomate of the American Board of Clinical Lipidology Attending Cardiologist  Direct Dial: [phone]  Fax: [phone]  Website:  [email or web address] Vinie JAYSON Mona 05/24/2024, 9:34 AM

## 2024-05-25 LAB — NMR, LIPOPROFILE
Cholesterol, Total: 105 mg/dL (ref 100–199)
HDL Particle Number: 29.5 umol/L — ABNORMAL LOW (ref >=30.5)
HDL-C: 54 mg/dL (ref >39)
LDL Particle Number: <300 nmol/L (ref <1000)
LDL-C (NIH Calc): 37 mg/dL (ref 0–99)
LP-IR Score: <25 (ref <=45)
Small LDL Particle Number: <90 nmol/L (ref <=527)
Triglycerides: 63 mg/dL (ref 0–149)

## 2024-05-25 LAB — LIPOPROTEIN A (LPA): Lipoprotein (a): 190 nmol/L — ABNORMAL HIGH (ref <75.0)

## 2024-05-25 LAB — URIC ACID: Uric Acid: 7.1 mg/dL (ref 3.8–8.4)

## 2024-05-30 ENCOUNTER — Ambulatory Visit: Payer: Self-pay | Admitting: Internal Medicine

## 2024-05-31 DIAGNOSIS — M109 Gout, unspecified: Secondary | ICD-10-CM | POA: Diagnosis not present

## 2024-05-31 DIAGNOSIS — G4700 Insomnia, unspecified: Secondary | ICD-10-CM | POA: Diagnosis not present

## 2024-06-02 ENCOUNTER — Encounter: Payer: Self-pay | Admitting: Internal Medicine

## 2024-07-06 DIAGNOSIS — L578 Other skin changes due to chronic exposure to nonionizing radiation: Secondary | ICD-10-CM | POA: Diagnosis not present

## 2024-07-06 DIAGNOSIS — L821 Other seborrheic keratosis: Secondary | ICD-10-CM | POA: Diagnosis not present

## 2024-07-06 DIAGNOSIS — L538 Other specified erythematous conditions: Secondary | ICD-10-CM | POA: Diagnosis not present

## 2024-07-06 DIAGNOSIS — L820 Inflamed seborrheic keratosis: Secondary | ICD-10-CM | POA: Diagnosis not present

## 2024-07-06 DIAGNOSIS — L2989 Other pruritus: Secondary | ICD-10-CM | POA: Diagnosis not present

## 2024-07-06 DIAGNOSIS — L570 Actinic keratosis: Secondary | ICD-10-CM | POA: Diagnosis not present

## 2024-07-12 ENCOUNTER — Encounter: Payer: Self-pay | Admitting: Internal Medicine

## 2024-07-20 ENCOUNTER — Ambulatory Visit: Attending: Internal Medicine | Admitting: Internal Medicine

## 2024-07-20 ENCOUNTER — Encounter: Payer: Self-pay | Admitting: Internal Medicine

## 2024-07-20 VITALS — BP 121/68 | HR 51 | Ht 73.0 in | Wt 201.0 lb

## 2024-07-20 DIAGNOSIS — R0789 Other chest pain: Secondary | ICD-10-CM

## 2024-07-20 DIAGNOSIS — I214 Non-ST elevation (NSTEMI) myocardial infarction: Secondary | ICD-10-CM

## 2024-07-20 DIAGNOSIS — I2510 Atherosclerotic heart disease of native coronary artery without angina pectoris: Secondary | ICD-10-CM

## 2024-07-20 NOTE — Patient Instructions (Signed)
 Medication Instructions:  NO CHANGES  *If you need a refill on your cardiac medications before your next appointment, please call your pharmacy*  Testing/Procedures: Dr. Mona has ordered a Myocardial Perfusion Imaging Study.   The test will take approximately 3 to 4 hours to complete; you may bring reading material.  If someone comes with you to your appointment, they will need to remain in the main lobby due to limited space in the testing area. **If you are pregnant or breastfeeding, please notify the nuclear lab prior to your appointment**  You will need to hold the following medications prior to your stress test: beta-blockers (24 hours prior to test)   How to prepare for your Myocardial Perfusion Test: Do not eat or drink 3 hours prior to your test, except you may have water. Do not consume products containing caffeine (regular or decaffeinated) 12 hours prior to your test. (ex: coffee, chocolate, sodas, tea). Do wear comfortable clothes (no dresses or overalls) and walking shoes, tennis shoes preferred (No heels or open toe shoes are allowed). Do NOT wear cologne, perfume, aftershave, or lotions (deodorant is allowed). If these instructions are not followed, your test will have to be rescheduled.   Follow-Up: At [facility], you and your health needs are our priority.  As part of our continuing mission to provide you with exceptional heart care, our providers are all part of one team.  This team includes your primary Cardiologist (physician) and Advanced Practice Providers or APPs (Physician Assistants and Nurse Practitioners) who all work together to provide you with the care you need, when you need it.  Your next appointment:    April 2026 -- call in Jan or Feb for this visit  We recommend signing up for the patient portal called MyChart.  Sign up information is provided on this After Visit Summary.  MyChart is used to connect with patients for Virtual Visits  (Telemedicine).  Patients are able to view lab/test results, encounter notes, upcoming appointments, etc.  Non-urgent messages can be sent to your provider as well.   To learn more about what you can do with MyChart, go to [email or web address].   Other Instructions

## 2024-07-20 NOTE — Progress Notes (Unsigned)
 LIPID CLINIC CONSULT NOTE  Chief Complaint:  Manage dyslipidemia, recent NSTEMI  Primary Care Physician: Richard Arthea Loving, MD  Primary Cardiologist:  None  HPI:  [name] is a 73 y.o. male who is being seen today for the evaluation of dyslipidemia at the request of Richard Arthea Loving, MD. this is a pleasant 73 year old male currently referred for evaluation management of dyslipidemia and recent NSTEMI.  He is a friend of Dr. Charlena Pineda.  [name] lives both in the [place] area [place] well as the [employer] near [place].  He was up in that area and developed chest pain, found to have a non-STEMI.  Troponin peaked to 0.63.  Echocardiogram was performed showing LVEF 60% with mild mitral regurgitation.  He went to the Cath Lab at [facility] healthcare facility [facility]) and underwent cardiac catheterization.  This demonstrated severe stenosis of the proximal to mid LAD up to 85% and underwent successful PCI with stenting x 2 with some haziness at the edge of the stent covered with an additional 3.5 x 15 Xience stent and a previously placed 3.5 x 28 mm Xience stent.  Overall the final result was excellent with TIMI-3 flow.  He reports since that episode he has done well denying any chest pain or recurrent shortness of breath.  He has not had a history of high cholesterol but did have family history of heart disease.  As part of his workup he underwent testing for LP(a) which was noted to be elevated at 200.2 nmol/L.  His lipid profile at the time of his MI in February showed total cholesterol 133, HDL 41, triglycerides 82 and LDL 80.  He was started on atorvastatin 80 mg daily but notes he has been having some myalgias with this.  He had previously been on statin therapy and had elevated CK in the past.  He desires assessment of his PSA as he has not had any lab work recently since his primary care provider has retired.  He wishes to establish general cardiology care with  me.  05/24/2024  Mr. [name] returns today for follow-up.  Overall he seems to be doing well.  He had stopped atorvastatin and was switched to Repatha  which he seems to tolerate.  He had an elevated LP(a).  We did genetic testing which showed an LPA variant, an APOA5 variant and an ApoE (E2/E3) variant.  He has a known elevated LP(a) just over 200.  Hopefully on Repatha  this is come down.  He notes no muscle aches that he had previously on this.  He did have an episode of gout.  His uric acid level was 7.5 over the summer but did not improve much a month later.  He has lost more than 10 pounds with dietary changes and cutting out alcohol.  He is interested to see if his uric acid level has improved.  He is not on allopurinol.  He has had some bruising on combination aspirin and clopidogrel .  PMHx:  No past medical history on file.  Past Surgical History:  Procedure Laterality Date   APPENDECTOMY     KNEE SURGERY      FAMHx:  No family history on file.  SOCHx:   reports that he has never smoked. He has never used smokeless tobacco. He reports current alcohol use. No history on file for drug use.  ALLERGIES:  Allergies  Allergen Reactions   Lactose Other (See Comments)    Causes constipation    ROS: Pertinent  items noted in HPI and remainder of comprehensive ROS otherwise negative.  HOME MEDS: Current Outpatient Medications on File Prior to Visit  Medication Sig Dispense Refill   aspirin EC 81 MG tablet Take 81 mg by mouth daily.     clopidogrel  (PLAVIX ) 75 MG tablet Take 75 mg by mouth daily.     Evolocumab  (REPATHA  SURECLICK) 140 MG/ML SOAJ Inject 140 mg into the skin every 14 (fourteen) days. 6 mL 3   clopidogrel  (PLAVIX ) 75 MG tablet Take 1 tablet (75 mg total) by mouth daily. Take 8 tablets (600 mg) one time (first dose), then continue to take 1 tablet (75 mg) daily the next day and thereafter 90 tablet 3   colchicine 0.6 MG tablet Take 0.6 mg by mouth as needed. (Patient not  taking: Reported on 07/20/2024)     No current facility-administered medications on file prior to visit.    LABS/IMAGING: No results found for this or any previous visit (from the past 48 hours). No results found.  LIPID PANEL: No results found for: CHOL, TRIG, HDL, CHOLHDL, VLDL, LDLCALC, LDLDIRECT  Lipoprotein (a)  Date/Time Value Ref Range Status  05/24/2024 10:47 AM 190.0 (H) <75.0 nmol/L Final    Comment:    Note:  Values greater than or equal to 75.0 nmol/L may        indicate an independent risk factor for CHD,        but must be evaluated with caution when applied        to non-Caucasian populations due to the        influence of genetic factors on Lp(a) across        ethnicities.      WEIGHTS: Wt Readings from Last 3 Encounters:  07/20/24 201 lb (91.2 kg)  05/24/24 191 lb 9.6 oz (86.9 kg)  02/16/24 196 lb (88.9 kg)    VITALS: BP 121/68 (BP Location: Left Arm, Patient Position: Sitting, Cuff Size: Normal)   Pulse (!) 51   Ht 6' 1 (1.854 m)   Wt 201 lb (91.2 kg)   SpO2 96%   BMI 26.52 kg/m   EXAM: General appearance: alert and no distress Lungs: clear to auscultation bilaterally Heart: regular rate and rhythm, S1, S2 normal, no murmur, click, rub or gallop Extremities: extremities normal, atraumatic, no cyanosis or edema Neurologic: Grossly normal  EKG: EKG Interpretation Date/Time:  Tuesday July 20 2024 13:27:01 EST Ventricular Rate:  51 PR Interval:  178 QRS Duration:  82 QT Interval:  438 QTC Calculation: 403 R Axis:   32  Text Interpretation: Sinus bradycardia When compared with ECG of 24-May-2024 09:30, No significant change was found Confirmed by Mona Kent [phone]) on 07/20/2024 1:40:31 PM    ASSESSMENT: NSTEMI with PCI (DES x 2, Xience) to the proximal to mid LAD (12/11/2023, [facility]) - LVEF 55-60% on echo, mild MR Genetic dyslipidemia with LPA gene variant, APO A5 and APO E (E2/E3) Dyslipidemia,  goal LDL less than 55 (very high risk) 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   Mr. Cartlidge continues to do well on Repatha .  He denies any of the muscle aches he had on the statins.  I am interested to see if his lipids are well-controlled on this as well as if he has had further improvement in LP(a).  Genetic testing showed several variants of  unknown significance with a prominence of LPA but also APO A5 and APO E.  He has recently had weight loss and dietary changes which may also improve his lipids.  Will plan fasting NMR and LP(a) today.  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  Uric acid level was 7.5.  Will repeat that today.  If his uric acid level remains above 6 I would advise evaluation by his PCP to consider starting allopurinol.  Fib Oxistat is contraindicated in patients with coronary artery disease.  Plan follow-up in about 6 months at which time we will consider coming off clopidogrel .  [name], MD, [facility], FNLA, FACP  [place]  [facility]  Medical Director of the Advanced Lipid Disorders &  Cardiovascular Risk Reduction Clinic Diplomate of the American Board of Clinical Lipidology Attending Cardiologist  Direct Dial: [phone]  Fax: [phone]  Website:  [email or web address]  [name] Dontez Hauss 07/20/2024, 1:40 PM

## 2024-07-22 ENCOUNTER — Telehealth (HOSPITAL_COMMUNITY): Payer: Self-pay | Admitting: *Deleted

## 2024-07-22 NOTE — Telephone Encounter (Signed)
 Patient given detailed instructions per Myocardial Perfusion Study Information Sheet for the test on 08/02/2024 at 10:45. Patient notified to arrive 15 minutes early and that it is imperative to arrive on time for appointment to keep from having the test rescheduled.  If you need to cancel or reschedule your appointment, please call the office within 24 hours of your appointment. . Patient verbalized understanding.[name]

## 2024-07-27 ENCOUNTER — Other Ambulatory Visit: Payer: Self-pay | Admitting: Internal Medicine

## 2024-07-27 DIAGNOSIS — R0789 Other chest pain: Secondary | ICD-10-CM

## 2024-07-27 DIAGNOSIS — I2510 Atherosclerotic heart disease of native coronary artery without angina pectoris: Secondary | ICD-10-CM

## 2024-08-02 ENCOUNTER — Ambulatory Visit (HOSPITAL_COMMUNITY)
Admission: RE | Admit: 2024-08-02 | Discharge: 2024-08-02 | Disposition: A | Discharge status: Home / Self Care | Source: Ambulatory Visit | Attending: Internal Medicine | Admitting: Internal Medicine

## 2024-08-02 ENCOUNTER — Ambulatory Visit: Payer: Self-pay | Admitting: Internal Medicine

## 2024-08-02 DIAGNOSIS — R0789 Other chest pain: Secondary | ICD-10-CM | POA: Insufficient documentation

## 2024-08-02 DIAGNOSIS — I2510 Atherosclerotic heart disease of native coronary artery without angina pectoris: Secondary | ICD-10-CM | POA: Diagnosis not present

## 2024-08-02 LAB — MYOCARDIAL PERFUSION IMAGING
Angina Index: 0
Duke Treadmill Score: 9
Estimated workload: 10.1
Exercise duration (min): 9 min
LV dias vol: 111 mL (ref 62–150)
LV sys vol: 47 mL
MPHR: 145 {beats}/min
Nuc Stress EF: 58 %
Peak HR: 133 {beats}/min
Percent HR: 92 %
RPE: 19
Rest HR: 49 {beats}/min
Rest Nuclear Isotope Dose: 10.2 mCi
SDS: 1
SRS: 14
SSS: 7
ST Depression (mm): 0 mm
Stress Nuclear Isotope Dose: 32.4 mCi
TID: 0.93

## 2024-08-02 MED ORDER — TECHNETIUM TC 99M TETROFOSMIN IV KIT
10.2000 | PACK | Freq: Once | INTRAVENOUS | Status: AC | PRN
  Administered 2024-08-02: 10.2 via INTRAVENOUS

## 2024-08-02 MED ORDER — TECHNETIUM TC 99M TETROFOSMIN IV KIT
30.5000 | PACK | Freq: Once | INTRAVENOUS | Status: AC | PRN
  Administered 2024-08-02: 30.5 via INTRAVENOUS

## 2024-08-04 ENCOUNTER — Ambulatory Visit: Payer: Self-pay | Admitting: Internal Medicine
